# Patient Record
Sex: Male | Born: 1950 | Race: White | Hispanic: No | Marital: Married | Smoking: Never smoker
Health system: Southern US, Community
[De-identification: ages and names within clinical notes are randomized; demographics above are authoritative.]

## PROBLEM LIST (undated history)

## (undated) DIAGNOSIS — M109 Gout, unspecified: Secondary | ICD-10-CM

## (undated) DIAGNOSIS — R112 Nausea with vomiting, unspecified: Secondary | ICD-10-CM

## (undated) DIAGNOSIS — T4145XA Adverse effect of unspecified anesthetic, initial encounter: Secondary | ICD-10-CM

## (undated) DIAGNOSIS — B019 Varicella without complication: Secondary | ICD-10-CM

## (undated) DIAGNOSIS — Z87442 Personal history of urinary calculi: Secondary | ICD-10-CM

## (undated) DIAGNOSIS — Z9889 Other specified postprocedural states: Secondary | ICD-10-CM

## (undated) DIAGNOSIS — S0990XA Unspecified injury of head, initial encounter: Secondary | ICD-10-CM

## (undated) DIAGNOSIS — T8859XA Other complications of anesthesia, initial encounter: Secondary | ICD-10-CM

## (undated) DIAGNOSIS — G510 Bell's palsy: Secondary | ICD-10-CM

## (undated) DIAGNOSIS — M1990 Unspecified osteoarthritis, unspecified site: Secondary | ICD-10-CM

## (undated) HISTORY — PX: LITHOTRIPSY: SUR834

## (undated) HISTORY — PX: COLONOSCOPY: SHX174

## (undated) HISTORY — PX: OTHER SURGICAL HISTORY: SHX169

---

## 1998-12-08 DIAGNOSIS — Z9889 Other specified postprocedural states: Secondary | ICD-10-CM

## 1998-12-08 DIAGNOSIS — R112 Nausea with vomiting, unspecified: Secondary | ICD-10-CM

## 1998-12-08 DIAGNOSIS — G510 Bell's palsy: Secondary | ICD-10-CM

## 1998-12-08 HISTORY — DX: Nausea with vomiting, unspecified: R11.2

## 1998-12-08 HISTORY — DX: Other specified postprocedural states: Z98.890

## 1998-12-08 HISTORY — DX: Bell's palsy: G51.0

## 2005-02-24 ENCOUNTER — Ambulatory Visit: Payer: Self-pay | Admitting: Unknown Physician Specialty

## 2005-09-05 ENCOUNTER — Emergency Department: Payer: Self-pay | Admitting: Emergency Medicine

## 2005-09-10 ENCOUNTER — Ambulatory Visit: Payer: Self-pay

## 2005-09-18 ENCOUNTER — Ambulatory Visit: Payer: Self-pay | Admitting: Urology

## 2007-08-24 ENCOUNTER — Ambulatory Visit: Payer: Self-pay | Admitting: Gastroenterology

## 2011-06-21 ENCOUNTER — Emergency Department: Payer: Self-pay | Admitting: *Deleted

## 2012-07-19 IMAGING — CT CT ABD-PELV W/O CM
1 of 2 series · 14 of 32 positions shown, 18 images · non-contrast
Comparison: none

REASON FOR EXAM: (1) right flank pain; (2) right flank pain
COMMENTS:   May transport without cardiac monitor

[Series 2: stone · axial · 0.74mm/px · z∈[-398,+10]mm · 14 of 154 slices shown, 18 images]
[im 12/154  soft-tissue]
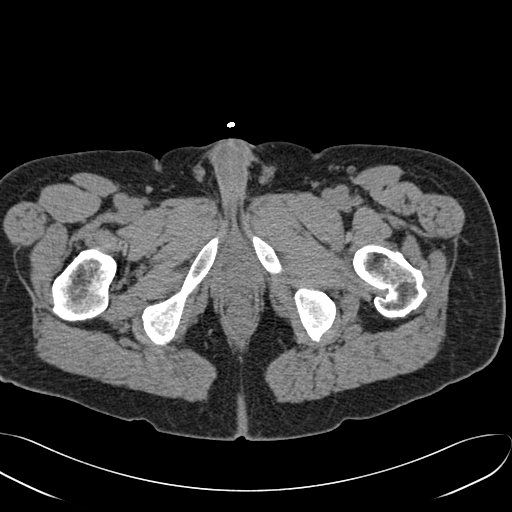
[im 12/154  bone]
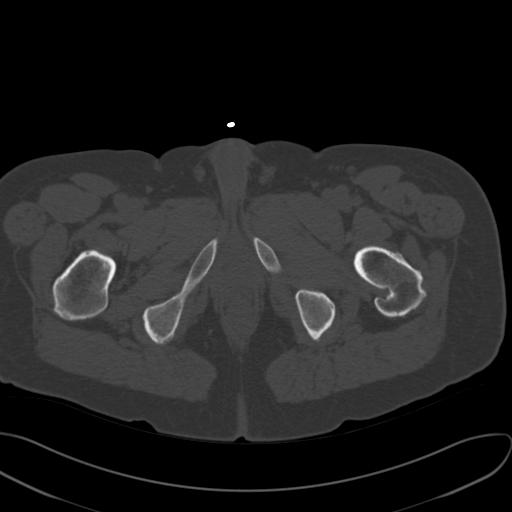
[im 24/154  soft-tissue]
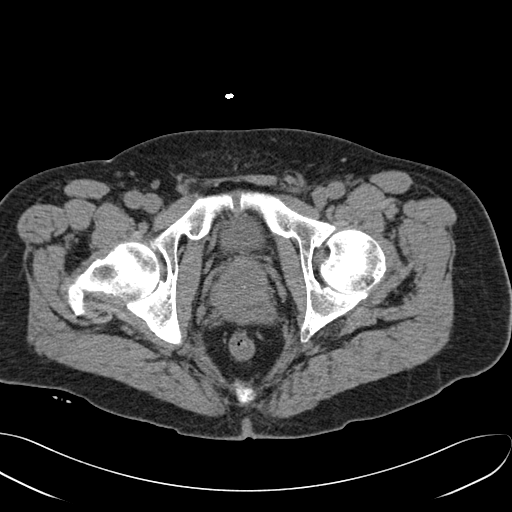
[im 36/154  soft-tissue]
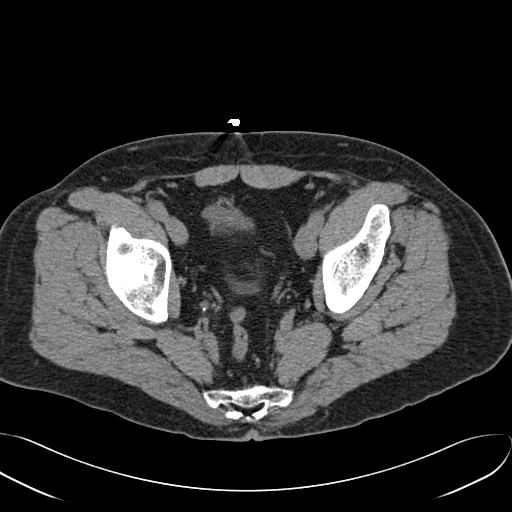
[im 48/154  soft-tissue]
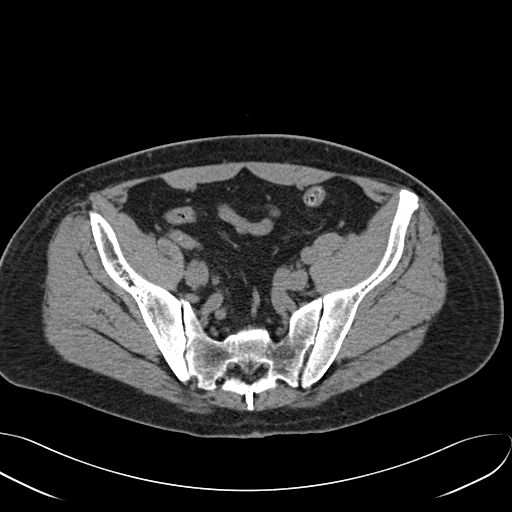
[im 59/154  soft-tissue]
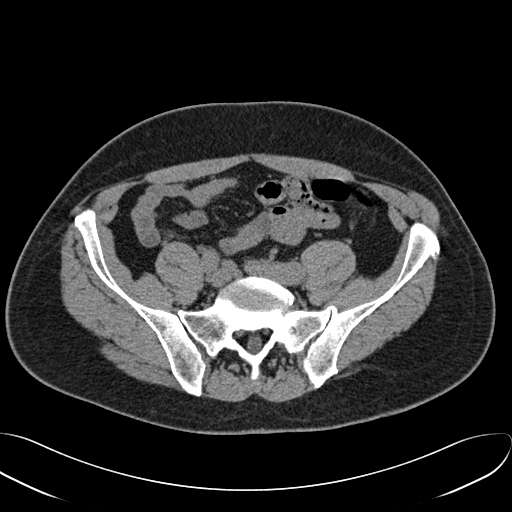
[im 71/154  soft-tissue]
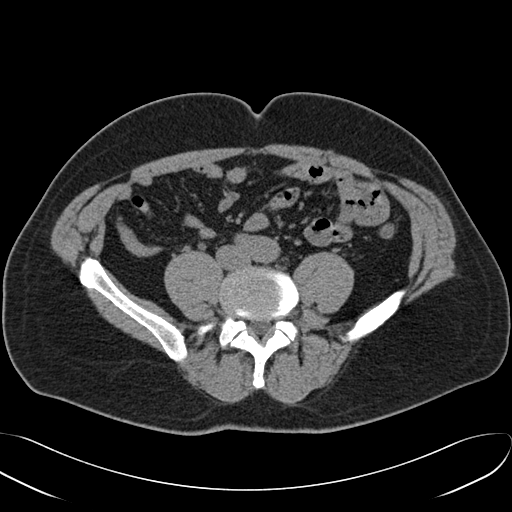
[im 83/154  soft-tissue]
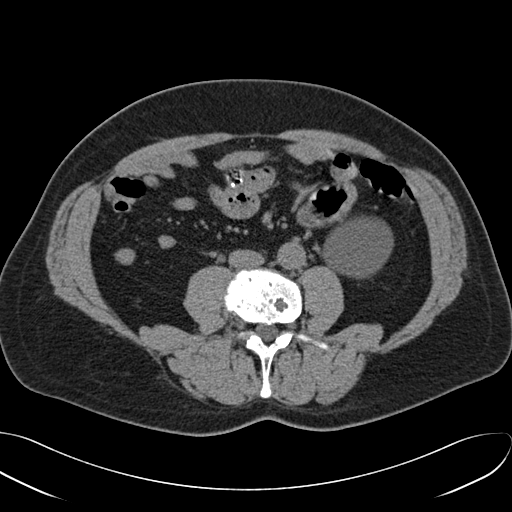
[im 95/154  soft-tissue]
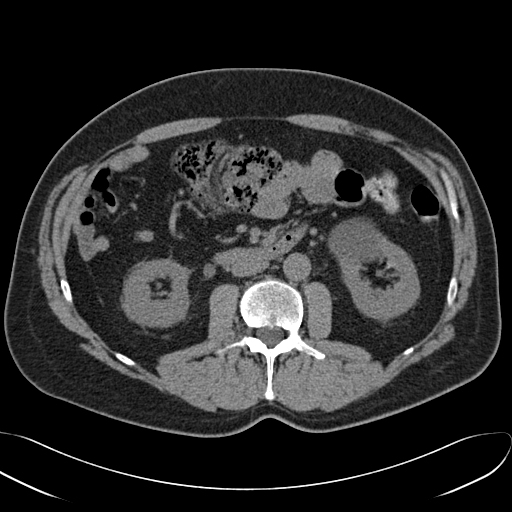
[im 106/154  soft-tissue]
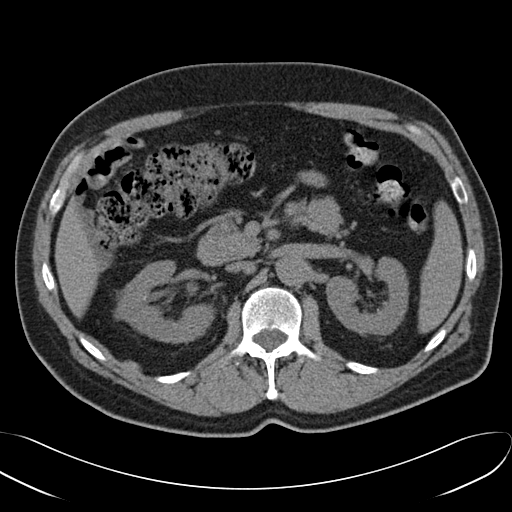
[im 106/154  bone]
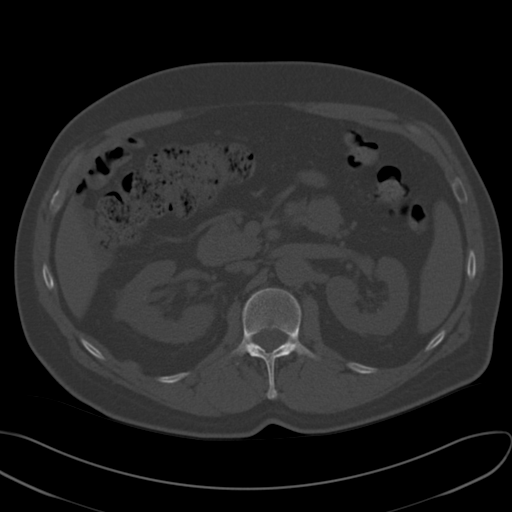
[im 118/154  soft-tissue]
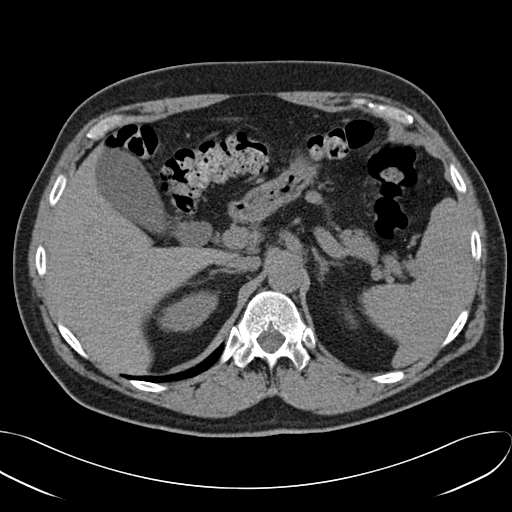
[im 130/154  soft-tissue]
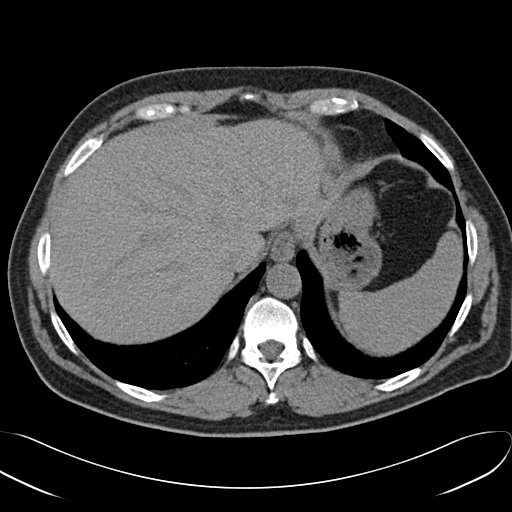
[im 130/154  lung]
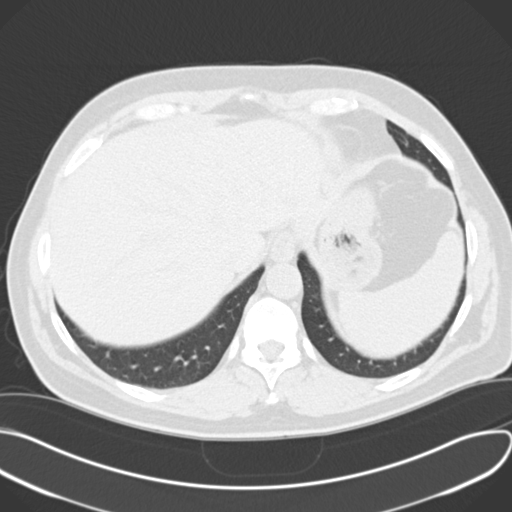
[im 136/154  lung]
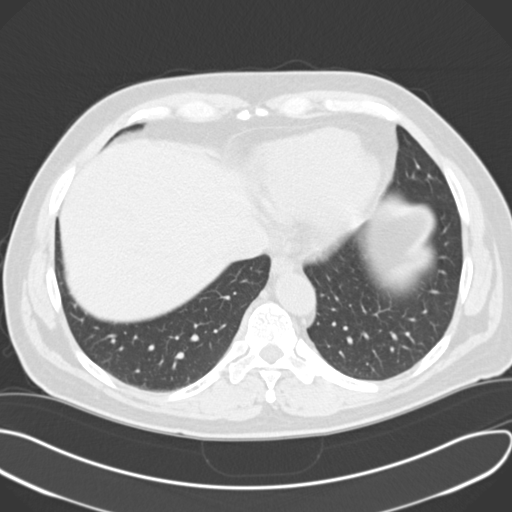
[im 142/154  soft-tissue]
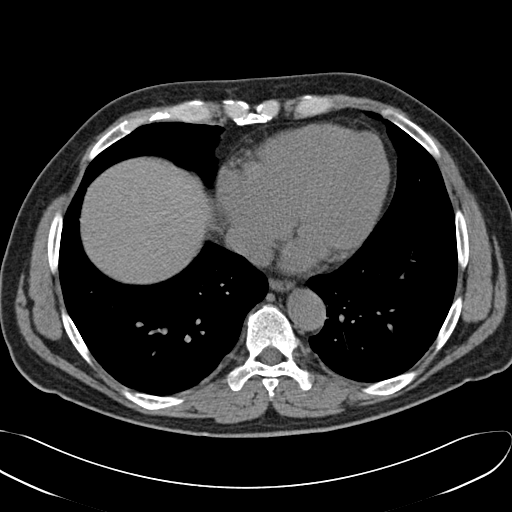
[im 142/154  lung]
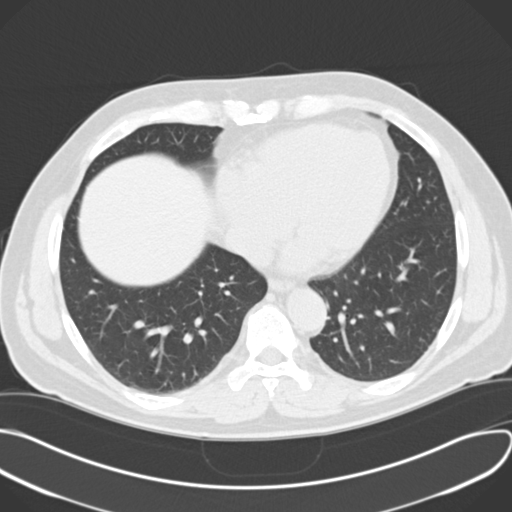
[im 148/154  lung]
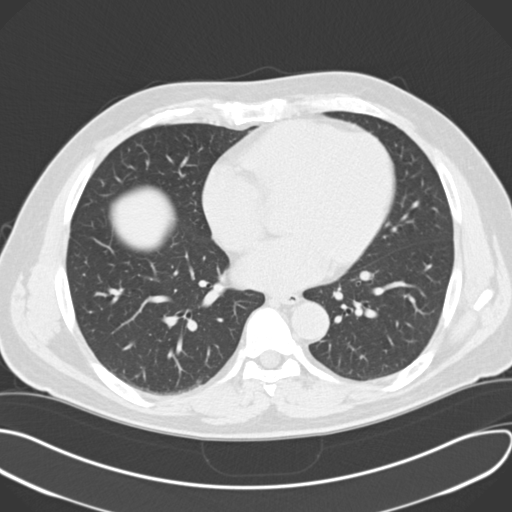

[14 of 32 positions shown; findings below may reference images not displayed]

PROCEDURE:     CT  - CT ABDOMEN AND PELVIS W[DATE]  [DATE]

RESULT:     Axial noncontrast CT scanning was performed through the abdomen
and pelvis at 3 mm intervals and slice thicknesses. Review of multiplanar
reconstructed images was performed separately on the VIA monitor.

There are stones in both kidneys. On the right there is mild hydronephrosis
and hydroureter secondary to a stone which is at the UVJ or just inside the
urinary bladder seen on image 125 through 127. The stone measures at least 4
mm in diameter. Other nonobstructing stones are present in the right kidney
with the largest measuring approximately 6 mm in diameter in a midpole
calyx. The left kidney exhibits stones measuring 5 mm in diameter but no
evidence of obstruction. There is an exophytic cystic appearing structure
with maximal dimension of 5.9 cm arising from the lower pole of the left
kidney. It exhibits Hounsfield measurement of +15.

The liver exhibits hypodensities in the left lobe which statistically are
most compatible with hemangiomas or cysts. These were faintly visible on a
CT scan dated 10 September, 2005. The gallbladder is adequately distended with
no evidence of stones. The spleen, nondistended stomach, pancreas, and
adrenal glands are within the limits of normal for the noncontrast study.
There is a small hiatal hernia. The caliber of the abdominal aorta is
normal. The unopacified loops of small and large bowel are normal in
appearance. The prostate gland and seminal vesicles exhibit no acute
abnormality. There are degenerative disc changes at the L3-L4 level with
evidence of a hemangioma in the body of L3. The lung bases are clear.
IMPRESSION: 1. There is mild hydronephrosis and hydroureter on the right secondary to a
stone about to pass into the urinary bladder or to a recently passed 4 mm
diameter stone.
2. There are stones elsewhere in both kidneys not producing obstruction.
There is likely a cyst from the lower pole of the left kidney.
3. I see no acute bowel abnormality. There are likely hemangiomas or cysts
in the left hepatic lobe.

A preliminary report was sent to the [HOSPITAL] the conclusion
of the study.

## 2014-10-17 DIAGNOSIS — G250 Essential tremor: Secondary | ICD-10-CM | POA: Insufficient documentation

## 2014-10-17 DIAGNOSIS — E7800 Pure hypercholesterolemia, unspecified: Secondary | ICD-10-CM | POA: Insufficient documentation

## 2015-10-22 DIAGNOSIS — M109 Gout, unspecified: Secondary | ICD-10-CM | POA: Insufficient documentation

## 2015-11-30 ENCOUNTER — Encounter
Admission: RE | Disposition: A | Discharge status: Home / Self Care | Payer: Self-pay | Source: Ambulatory Visit | Attending: Gastroenterology

## 2015-11-30 ENCOUNTER — Ambulatory Visit
Admission: RE | Admit: 2015-11-30 | Discharge: 2015-11-30 | Disposition: A | Discharge status: Home / Self Care | Payer: Managed Care, Other (non HMO) | Source: Ambulatory Visit | Attending: Gastroenterology | Admitting: Gastroenterology

## 2015-11-30 ENCOUNTER — Ambulatory Visit: Payer: Managed Care, Other (non HMO) | Admitting: Anesthesiology

## 2015-11-30 ENCOUNTER — Encounter: Payer: Self-pay | Admitting: Anesthesiology

## 2015-11-30 DIAGNOSIS — Z86010 Personal history of colonic polyps: Secondary | ICD-10-CM | POA: Insufficient documentation

## 2015-11-30 DIAGNOSIS — E7800 Pure hypercholesterolemia, unspecified: Secondary | ICD-10-CM | POA: Diagnosis not present

## 2015-11-30 DIAGNOSIS — M109 Gout, unspecified: Secondary | ICD-10-CM | POA: Diagnosis not present

## 2015-11-30 DIAGNOSIS — M1990 Unspecified osteoarthritis, unspecified site: Secondary | ICD-10-CM | POA: Diagnosis not present

## 2015-11-30 DIAGNOSIS — Z79899 Other long term (current) drug therapy: Secondary | ICD-10-CM | POA: Diagnosis not present

## 2015-11-30 DIAGNOSIS — Z1211 Encounter for screening for malignant neoplasm of colon: Secondary | ICD-10-CM | POA: Diagnosis present

## 2015-11-30 DIAGNOSIS — Z87442 Personal history of urinary calculi: Secondary | ICD-10-CM | POA: Insufficient documentation

## 2015-11-30 DIAGNOSIS — G709 Myoneural disorder, unspecified: Secondary | ICD-10-CM | POA: Insufficient documentation

## 2015-11-30 HISTORY — DX: Varicella without complication: B01.9

## 2015-11-30 HISTORY — PX: COLONOSCOPY WITH PROPOFOL: SHX5780

## 2015-11-30 HISTORY — DX: Bell's palsy: G51.0

## 2015-11-30 HISTORY — DX: Gout, unspecified: M10.9

## 2015-11-30 HISTORY — DX: Unspecified osteoarthritis, unspecified site: M19.90

## 2015-11-30 HISTORY — DX: Unspecified injury of head, initial encounter: S09.90XA

## 2015-11-30 SURGERY — COLONOSCOPY WITH PROPOFOL
Anesthesia: General

## 2015-11-30 MED ORDER — DEXAMETHASONE SODIUM PHOSPHATE 10 MG/ML IJ SOLN: INTRAMUSCULAR | Status: DC | PRN

## 2015-11-30 MED ORDER — FENTANYL CITRATE (PF) 100 MCG/2ML IJ SOLN
INTRAMUSCULAR | Status: DC | PRN
  Administered 2015-11-30: 50 ug via INTRAVENOUS

## 2015-11-30 MED ORDER — MIDAZOLAM HCL 2 MG/2ML IJ SOLN
INTRAMUSCULAR | Status: DC | PRN
  Administered 2015-11-30: 1 mg via INTRAVENOUS

## 2015-11-30 MED ORDER — PROPOFOL 10 MG/ML IV BOLUS
INTRAVENOUS | Status: DC | PRN
  Administered 2015-11-30: 30 mg via INTRAVENOUS

## 2015-11-30 MED ORDER — PROPOFOL 500 MG/50ML IV EMUL
INTRAVENOUS | Status: DC | PRN
  Administered 2015-11-30: 80 ug/kg/min via INTRAVENOUS

## 2015-11-30 MED ORDER — SODIUM CHLORIDE 0.9 % IV SOLN
INTRAVENOUS | Status: DC
  Administered 2015-11-30: 1000 mL via INTRAVENOUS

## 2015-11-30 MED ORDER — SODIUM CHLORIDE 0.9 % IV SOLN
INTRAVENOUS | Status: DC
  Administered 2015-11-30: 09:00:00 via INTRAVENOUS

## 2015-11-30 NOTE — Transfer of Care (Signed)
Immediate Anesthesia Transfer of Care Note  Patient: Edward Petersen  Procedure(s) Performed: Procedure(s): COLONOSCOPY WITH PROPOFOL (N/A)  Patient Location: PACU  Anesthesia Type:General  Level of Consciousness: awake, alert  and oriented  Airway & Oxygen Therapy: Patient Spontanous Breathing and Patient connected to nasal cannula oxygen  Post-op Assessment: Report given to RN and Post -op Vital signs reviewed and stable  Post vital signs: Reviewed and stable  Last Vitals:  Filed Vitals:   11/30/15 0836  BP: 124/79  Pulse: 67  Temp: 36.1 C  Resp: 16    Complications: No apparent anesthesia complications

## 2015-11-30 NOTE — Anesthesia Preprocedure Evaluation (Signed)
Anesthesia Evaluation  Patient identified by MRN, date of birth, ID band Patient awake    Reviewed: Allergy & Precautions, NPO status , Patient's Chart, lab work & pertinent test results, reviewed documented beta blocker date and time   Airway Mallampati: II  TM Distance: >3 FB     Dental  (+) Chipped   Pulmonary           Cardiovascular      Neuro/Psych  Neuromuscular disease    GI/Hepatic   Endo/Other    Renal/GU Renal InsufficiencyRenal disease     Musculoskeletal  (+) Arthritis ,   Abdominal   Peds  Hematology   Anesthesia Other Findings   Reproductive/Obstetrics                             Anesthesia Physical Anesthesia Plan  ASA: III  Anesthesia Plan: General   Post-op Pain Management:    Induction: Intravenous  Airway Management Planned: Nasal Cannula  Additional Equipment:   Intra-op Plan:   Post-operative Plan:   Informed Consent: I have reviewed the patients History and Physical, chart, labs and discussed the procedure including the risks, benefits and alternatives for the proposed anesthesia with the patient or authorized representative who has indicated his/her understanding and acceptance.     Plan Discussed with: CRNA  Anesthesia Plan Comments:         Anesthesia Quick Evaluation

## 2015-11-30 NOTE — H&P (Signed)
    Primary Care Physician:  Marcello Fennel, MD Primary Gastroenterologist:  Dr. Candace Cruise  Pre-Procedure History & Physical: HPI:  Edward Petersen is a 65 y.o. male is here for an colonoscopy.   Past Medical History  Diagnosis Date  . Bell's palsy   . Chickenpox   . Gout   . Kidney stones   . Traumatic injury of head   . Hypercholesteremia   . Osteoarthritis     Past Surgical History  Procedure Laterality Date  . Topaz of right elbow    . Lithotripsy    . Left shoulder rotator cuff repair    . Colonoscopy      Prior to Admission medications   Medication Sig Start Date End Date Taking? Authorizing Provider  Multiple Vitamin (MULTIVITAMIN) tablet Take 1 tablet by mouth daily.   Yes Historical Provider, MD  naproxen sodium (ANAPROX) 220 MG tablet Take 220 mg by mouth 2 (two) times daily with a meal.   Yes Historical Provider, MD  omega-3 acid ethyl esters (LOVAZA) 1 G capsule Take 1 g by mouth 2 (two) times daily.   Yes Historical Provider, MD    Allergies as of 11/20/2015  . (Not on File)    History reviewed. No pertinent family history.  Social History   Social History  . Marital Status: Married    Spouse Name: N/A  . Number of Children: N/A  . Years of Education: N/A   Occupational History  . Not on file.   Social History Main Topics  . Smoking status: Not on file  . Smokeless tobacco: Not on file  . Alcohol Use: Not on file  . Drug Use: Not on file  . Sexual Activity: Not on file   Other Topics Concern  . Not on file   Social History Narrative    Review of Systems: See HPI, otherwise negative ROS  Physical Exam: BP 124/79 mmHg  Pulse 67  Temp(Src) 96.9 F (36.1 C) (Oral)  Resp 16  Ht 5\' 9"  (1.753 m)  Wt 88.451 kg (195 lb)  BMI 28.78 kg/m2  SpO2 98% General:   Alert,  pleasant and cooperative in NAD Head:  Normocephalic and atraumatic. Neck:  Supple; no masses or thyromegaly. Lungs:  Clear throughout to auscultation.    Heart:  Regular rate  and rhythm. Abdomen:  Soft, nontender and nondistended. Normal bowel sounds, without guarding, and without rebound.   Neurologic:  Alert and  oriented x4;  grossly normal neurologically.  Impression/Plan: Edward Petersen is here for an colonoscopy to be performed for personal hx of colon polyps.  Risks, benefits, limitations, and alternatives regarding  colonoscopy have been reviewed with the patient.  Questions have been answered.  All parties agreeable.   Dymond Spreen, Lupita Dawn, MD  11/30/2015, 9:27 AM

## 2015-11-30 NOTE — Op Note (Signed)
[facility] Gastroenterology Patient Name: Edward Petersen Procedure Date: 11/30/2015 9:30 AM MRN: [id number] Account #: [id number] Date of Birth: [birth year] Admit Type: Outpatient Age: 64 Room: [facility] ENDO ROOM 4 Gender: Male Note Status: Finalized Procedure:         Colonoscopy Indications:       Personal history of colonic polyps Providers:         Lupita Dawn. Candace Cruise, MD Referring MD:      Caprice Renshaw (Referring MD) Medicines:         Monitored Anesthesia Care Complications:     No immediate complications. Procedure:         Pre-Anesthesia Assessment:                    - Prior to the procedure, a History and Physical was                     performed, and patient medications, allergies and                     sensitivities were reviewed. The patient's tolerance of                     previous anesthesia was reviewed.                    - The risks and benefits of the procedure and the sedation                     options and risks were discussed with the patient. All                     questions were answered and informed consent was obtained.                    - After reviewing the risks and benefits, the patient was                     deemed in satisfactory condition to undergo the procedure.                    After obtaining informed consent, the colonoscope was                     passed under direct vision. Throughout the procedure, the                     patient's blood pressure, pulse, and oxygen saturations                     were monitored continuously. The Colonoscope was                     introduced through the anus and advanced to the the cecum,                     identified by appendiceal orifice and ileocecal valve. The                     colonoscopy was performed without difficulty. The patient                     tolerated the procedure well. The quality of the bowel  preparation was good. Findings:      The colon  (entire examined portion) appeared normal. Impression:        - The entire examined colon is normal.                    - No specimens collected. Recommendation:    - Discharge patient to home.                    - Repeat colonoscopy in 5 years for surveillance.                    - The findings and recommendations were discussed with the                     patient. Procedure Code(s): --- Professional ---                    [phone], Colonoscopy, flexible; diagnostic, including                     collection of specimen(s) by brushing or washing, when                     performed (separate procedure) Diagnosis Code(s): --- Professional ---                    Z86.010, Personal history of colonic polyps CPT copyright 2014 American Medical Association. All rights reserved. The codes documented in this report are preliminary and upon coder review may  be revised to meet current compliance requirements. Hulen Luster, MD 11/30/2015 9:49:03 AM This report has been signed electronically. Number of Addenda: 0 Note Initiated On: 11/30/2015 9:30 AM Scope Withdrawal Time: 0 hours 4 minutes 46 seconds  Total Procedure Duration: 0 hours 8 minutes 27 seconds       [facility]

## 2015-11-30 NOTE — Anesthesia Postprocedure Evaluation (Signed)
Anesthesia Post Note  Patient: Edward Petersen  Procedure(s) Performed: Procedure(s) (LRB): COLONOSCOPY WITH PROPOFOL (N/A)  Patient location during evaluation: Endoscopy Anesthesia Type: General Level of consciousness: awake Pain management: pain level controlled Vital Signs Assessment: post-procedure vital signs reviewed and stable Respiratory status: spontaneous breathing Cardiovascular status: blood pressure returned to baseline Anesthetic complications: no    Last Vitals:  Filed Vitals:   11/30/15 0836 11/30/15 0953  BP: 124/79 115/79  Pulse: 67 59  Temp: 36.1 C 36.4 C  Resp: 16 13    Last Pain:  Filed Vitals:   11/30/15 1023  PainSc: 0-No pain                 Ziyonna Christner S

## 2015-12-04 ENCOUNTER — Encounter: Payer: Self-pay | Admitting: Gastroenterology

## 2016-12-09 ENCOUNTER — Inpatient Hospital Stay: Payer: Managed Care, Other (non HMO) | Admitting: Anesthesiology

## 2016-12-09 ENCOUNTER — Inpatient Hospital Stay
Admission: EM | Admit: 2016-12-09 | Discharge: 2016-12-10 | DRG: 694 | Disposition: A | Discharge status: Home / Self Care | Payer: Managed Care, Other (non HMO) | Attending: Internal Medicine | Admitting: Internal Medicine

## 2016-12-09 ENCOUNTER — Encounter: Payer: Self-pay | Admitting: *Deleted

## 2016-12-09 ENCOUNTER — Encounter
Admission: EM | Disposition: A | Discharge status: Home / Self Care | Payer: Self-pay | Source: Home / Self Care | Attending: Internal Medicine

## 2016-12-09 ENCOUNTER — Emergency Department: Payer: Managed Care, Other (non HMO)

## 2016-12-09 DIAGNOSIS — N400 Enlarged prostate without lower urinary tract symptoms: Secondary | ICD-10-CM | POA: Diagnosis present

## 2016-12-09 DIAGNOSIS — M109 Gout, unspecified: Secondary | ICD-10-CM | POA: Diagnosis present

## 2016-12-09 DIAGNOSIS — Z79899 Other long term (current) drug therapy: Secondary | ICD-10-CM

## 2016-12-09 DIAGNOSIS — R109 Unspecified abdominal pain: Secondary | ICD-10-CM

## 2016-12-09 DIAGNOSIS — N136 Pyonephrosis: Secondary | ICD-10-CM | POA: Diagnosis present

## 2016-12-09 DIAGNOSIS — N201 Calculus of ureter: Principal | ICD-10-CM

## 2016-12-09 DIAGNOSIS — Z87442 Personal history of urinary calculi: Secondary | ICD-10-CM | POA: Diagnosis not present

## 2016-12-09 DIAGNOSIS — Z888 Allergy status to other drugs, medicaments and biological substances status: Secondary | ICD-10-CM | POA: Diagnosis not present

## 2016-12-09 DIAGNOSIS — N179 Acute kidney failure, unspecified: Secondary | ICD-10-CM

## 2016-12-09 DIAGNOSIS — N281 Cyst of kidney, acquired: Secondary | ICD-10-CM | POA: Diagnosis present

## 2016-12-09 DIAGNOSIS — N12 Tubulo-interstitial nephritis, not specified as acute or chronic: Secondary | ICD-10-CM

## 2016-12-09 HISTORY — PX: CYSTOSCOPY WITH STENT PLACEMENT: SHX5790

## 2016-12-09 HISTORY — PX: CYSTOSCOPY W/ RETROGRADES: SHX1426

## 2016-12-09 LAB — COMPREHENSIVE METABOLIC PANEL
ALBUMIN: 3.7 g/dL (ref 3.5–5.0)
ALT: 18 U/L (ref 17–63)
ANION GAP: 8 (ref 5–15)
AST: 19 U/L (ref 15–41)
Alkaline Phosphatase: 70 U/L (ref 38–126)
BUN: 19 mg/dL (ref 6–20)
CO2: 29 mmol/L (ref 22–32)
Calcium: 9.4 mg/dL (ref 8.9–10.3)
Chloride: 99 mmol/L — ABNORMAL LOW (ref 101–111)
Creatinine, Ser: 1.88 mg/dL — ABNORMAL HIGH (ref 0.61–1.24)
GFR calc Af Amer: 42 mL/min — ABNORMAL LOW (ref >60)
GFR calc non Af Amer: 36 mL/min — ABNORMAL LOW (ref >60)
GLUCOSE: 106 mg/dL — AB (ref 65–99)
POTASSIUM: 3.8 mmol/L (ref 3.5–5.1)
SODIUM: 136 mmol/L (ref 135–145)
TOTAL PROTEIN: 7.5 g/dL (ref 6.5–8.1)
Total Bilirubin: 1.5 mg/dL — ABNORMAL HIGH (ref 0.3–1.2)

## 2016-12-09 LAB — URINALYSIS, COMPLETE (UACMP) WITH MICROSCOPIC
Bacteria, UA: NONE SEEN
Bilirubin Urine: NEGATIVE
GLUCOSE, UA: NEGATIVE mg/dL
Ketones, ur: 5 mg/dL — AB
NITRITE: NEGATIVE
Protein, ur: 30 mg/dL — AB
SPECIFIC GRAVITY, URINE: 1.016 (ref 1.005–1.030)
pH: 5 (ref 5.0–8.0)

## 2016-12-09 LAB — CBC
HCT: 40.5 % (ref 40.0–52.0)
Hemoglobin: 14.4 g/dL (ref 13.0–18.0)
MCH: 31.8 pg (ref 26.0–34.0)
MCHC: 35.5 g/dL (ref 32.0–36.0)
MCV: 89.6 fL (ref 80.0–100.0)
PLATELETS: 199 10*3/uL (ref 150–440)
RBC: 4.52 MIL/uL (ref 4.40–5.90)
RDW: 13.1 % (ref 11.5–14.5)
WBC: 11.6 10*3/uL — ABNORMAL HIGH (ref 3.8–10.6)

## 2016-12-09 LAB — SURGICAL PCR SCREEN
MRSA, PCR: NEGATIVE
Staphylococcus aureus: NEGATIVE

## 2016-12-09 LAB — LIPASE, BLOOD: Lipase: 16 U/L (ref 11–51)

## 2016-12-09 SURGERY — CYSTOSCOPY, WITH RETROGRADE PYELOGRAM
Anesthesia: General | Laterality: Bilateral

## 2016-12-09 MED ORDER — DEXTROSE 5 % IV SOLN: 1.0000 g | INTRAVENOUS | Status: DC

## 2016-12-09 MED ORDER — PSEUDOEPHEDRINE HCL 30 MG PO TABS
30.0000 mg | ORAL_TABLET | ORAL | Status: DC | PRN
  Filled 2016-12-09: qty 1

## 2016-12-09 MED ORDER — ONDANSETRON HCL 4 MG/2ML IJ SOLN
INTRAMUSCULAR | Status: AC
  Filled 2016-12-09: qty 2

## 2016-12-09 MED ORDER — PROPOFOL 10 MG/ML IV BOLUS
INTRAVENOUS | Status: AC
  Filled 2016-12-09: qty 20

## 2016-12-09 MED ORDER — IOPAMIDOL (ISOVUE-300) INJECTION 61%
INTRAVENOUS | Status: DC | PRN
  Administered 2016-12-09: 25 mL via URETHRAL

## 2016-12-09 MED ORDER — GLYCOPYRROLATE 0.2 MG/ML IJ SOLN
INTRAMUSCULAR | Status: DC | PRN
  Administered 2016-12-09: 0.2 mg via INTRAVENOUS

## 2016-12-09 MED ORDER — DEXTROSE 5 % IV SOLN: 1.0000 g | Freq: Once | INTRAVENOUS | Status: DC

## 2016-12-09 MED ORDER — FENTANYL CITRATE (PF) 100 MCG/2ML IJ SOLN
INTRAMUSCULAR | Status: AC
  Filled 2016-12-09: qty 2

## 2016-12-09 MED ORDER — MIDAZOLAM HCL 2 MG/2ML IJ SOLN
INTRAMUSCULAR | Status: AC
  Filled 2016-12-09: qty 2

## 2016-12-09 MED ORDER — LIDOCAINE 2% (20 MG/ML) 5 ML SYRINGE
INTRAMUSCULAR | Status: AC
  Filled 2016-12-09: qty 5

## 2016-12-09 MED ORDER — MORPHINE SULFATE (PF) 2 MG/ML IV SOLN
2.0000 mg | INTRAVENOUS | Status: DC | PRN
  Filled 2016-12-09: qty 1

## 2016-12-09 MED ORDER — DEXAMETHASONE SODIUM PHOSPHATE 10 MG/ML IJ SOLN
INTRAMUSCULAR | Status: AC
  Filled 2016-12-09: qty 1

## 2016-12-09 MED ORDER — DEXAMETHASONE SODIUM PHOSPHATE 10 MG/ML IJ SOLN
INTRAMUSCULAR | Status: DC | PRN
  Administered 2016-12-09: 5 mg via INTRAVENOUS

## 2016-12-09 MED ORDER — MORPHINE SULFATE (PF) 4 MG/ML IV SOLN
4.0000 mg | Freq: Once | INTRAVENOUS | Status: AC
  Administered 2016-12-09: 4 mg via INTRAVENOUS
  Filled 2016-12-09: qty 1

## 2016-12-09 MED ORDER — ADULT MULTIVITAMIN W/MINERALS CH
1.0000 | ORAL_TABLET | Freq: Every day | ORAL | Status: DC
  Administered 2016-12-10: 1 via ORAL
  Filled 2016-12-09: qty 1

## 2016-12-09 MED ORDER — CEFTRIAXONE SODIUM-DEXTROSE 1-3.74 GM-% IV SOLR
1.0000 g | INTRAVENOUS | Status: DC
  Administered 2016-12-10: 1 g via INTRAVENOUS
  Filled 2016-12-09: qty 50

## 2016-12-09 MED ORDER — ONDANSETRON HCL 4 MG/2ML IJ SOLN
4.0000 mg | Freq: Once | INTRAMUSCULAR | Status: AC
  Administered 2016-12-09: 4 mg via INTRAVENOUS
  Filled 2016-12-09: qty 2

## 2016-12-09 MED ORDER — HYDROCODONE-ACETAMINOPHEN 5-325 MG PO TABS
1.0000 | ORAL_TABLET | Freq: Four times a day (QID) | ORAL | Status: DC | PRN
  Administered 2016-12-09: 1 via ORAL
  Filled 2016-12-09: qty 1

## 2016-12-09 MED ORDER — LIDOCAINE HCL (PF) 2 % IJ SOLN
INTRAMUSCULAR | Status: DC | PRN
  Administered 2016-12-09: 100 mg

## 2016-12-09 MED ORDER — MIDAZOLAM HCL 5 MG/5ML IJ SOLN
INTRAMUSCULAR | Status: DC | PRN
  Administered 2016-12-09: 2 mg via INTRAVENOUS

## 2016-12-09 MED ORDER — GLYCOPYRROLATE 0.2 MG/ML IJ SOLN
INTRAMUSCULAR | Status: AC
  Filled 2016-12-09: qty 1

## 2016-12-09 MED ORDER — OMEGA-3-ACID ETHYL ESTERS 1 G PO CAPS
1.0000 g | ORAL_CAPSULE | Freq: Two times a day (BID) | ORAL | Status: DC
  Administered 2016-12-09 – 2016-12-10 (×2): 1 g via ORAL
  Filled 2016-12-09 (×2): qty 1

## 2016-12-09 MED ORDER — TAMSULOSIN HCL 0.4 MG PO CAPS
0.4000 mg | ORAL_CAPSULE | Freq: Every day | ORAL | Status: DC
  Administered 2016-12-10: 0.4 mg via ORAL
  Filled 2016-12-09: qty 1

## 2016-12-09 MED ORDER — ONDANSETRON HCL 4 MG/2ML IJ SOLN
INTRAMUSCULAR | Status: DC | PRN
  Administered 2016-12-09: 4 mg via INTRAVENOUS

## 2016-12-09 MED ORDER — CEFTRIAXONE SODIUM-DEXTROSE 1-3.74 GM-% IV SOLR
1.0000 g | Freq: Once | INTRAVENOUS | Status: AC
  Administered 2016-12-09: 1 g via INTRAVENOUS
  Filled 2016-12-09: qty 50

## 2016-12-09 MED ORDER — PROPOFOL 10 MG/ML IV BOLUS
INTRAVENOUS | Status: DC | PRN
  Administered 2016-12-09: 150 mg via INTRAVENOUS

## 2016-12-09 MED ORDER — SODIUM CHLORIDE 0.9 % IV SOLN
INTRAVENOUS | Status: DC
  Administered 2016-12-09: 1000 mL via INTRAVENOUS

## 2016-12-09 MED ORDER — ONDANSETRON HCL 4 MG/2ML IJ SOLN: 4.0000 mg | Freq: Four times a day (QID) | INTRAMUSCULAR | Status: DC | PRN

## 2016-12-09 MED ORDER — FENTANYL CITRATE (PF) 100 MCG/2ML IJ SOLN
INTRAMUSCULAR | Status: DC | PRN
  Administered 2016-12-09 (×2): 50 ug via INTRAVENOUS

## 2016-12-09 SURGICAL SUPPLY — 22 items
BAG DRAIN CYSTO-URO LG1000N (MISCELLANEOUS) ×3 IMPLANT
CATH URETL OPEN END 6X70 (CATHETERS) IMPLANT
CONRAY 43 FOR UROLOGY 50M (MISCELLANEOUS) ×3 IMPLANT
GLOVE BIO SURGEON STRL SZ7 (GLOVE) ×3 IMPLANT
GLOVE BIO SURGEON STRL SZ7.5 (GLOVE) ×3 IMPLANT
GOWN STRL REUS W/ TWL LRG LVL3 (GOWN DISPOSABLE) ×2 IMPLANT
GOWN STRL REUS W/ TWL XL LVL3 (GOWN DISPOSABLE) ×1 IMPLANT
GOWN STRL REUS W/TWL LRG LVL3 (GOWN DISPOSABLE) ×4
GOWN STRL REUS W/TWL XL LVL3 (GOWN DISPOSABLE) ×2
KIT RM TURNOVER CYSTO AR (KITS) ×3 IMPLANT
PACK CYSTO AR (MISCELLANEOUS) ×3 IMPLANT
PREP PVP WINGED SPONGE (MISCELLANEOUS) ×3 IMPLANT
SENSORWIRE 0.038 NOT ANGLED (WIRE) ×3
SOL .9 NS 3000ML IRR  AL (IV SOLUTION) ×2
SOL .9 NS 3000ML IRR UROMATIC (IV SOLUTION) ×1 IMPLANT
STENT URET 6FRX26 CONTOUR (STENTS) ×6 IMPLANT
SURGILUBE 2OZ TUBE FLIPTOP (MISCELLANEOUS) ×3 IMPLANT
SYRINGE 10CC LL (SYRINGE) ×3 IMPLANT
SYRINGE IRR TOOMEY STRL 70CC (SYRINGE) ×3 IMPLANT
TUBE FEED INF 8F (TUBING) IMPLANT
WATER STERILE IRR 1000ML POUR (IV SOLUTION) ×3 IMPLANT
WIRE SENSOR 0.038 NOT ANGLED (WIRE) ×1 IMPLANT

## 2016-12-09 NOTE — H&P (Signed)
[place] [facility] [place] NAME: Edward Petersen    MR#:  [id number]  DATE OF BIRTH:  12-15-1950  DATE OF ADMISSION:  12/09/2016  PRIMARY CARE PHYSICIAN: BABAOFF, Caryl Bis, MD   REQUESTING/REFERRING PHYSICIAN: kinner  CHIEF COMPLAINT:   Chief Complaint  Patient presents with  . Flank Pain    HISTORY OF PRESENT ILLNESS: Edward Petersen  is a 66 y.o. male with a known history of Bell's was C, gout, hypercholesterolemia, kidney stones- not on any regular home medications. Started having left-sided flank pain for last 3-4 days. He went to his primary care physician and he gave him hydrocodone to help with the pain as he had kidney stones in the past, patient did not do any further workup and suggested to watch if he passes the stone. He still did not pass the stone and continued to have pain in the left side flank location. He denies any fever or chills. But was feeling somewhat nauseated. In ER he is noted to have bilateral ureteral stone with left-sided hydronephrosis and worsening of the renal function. ER physician spoke to urologist, he suggested to do a procedure today in the evening and admitted to medical services.  PAST MEDICAL HISTORY:   Past Medical History:  Diagnosis Date  . Bell's palsy   . Chickenpox   . Gout   . Hypercholesteremia   . Kidney stones   . Osteoarthritis   . Traumatic injury of head     PAST SURGICAL HISTORY: Past Surgical History:  Procedure Laterality Date  . COLONOSCOPY    . COLONOSCOPY WITH PROPOFOL N/A 11/30/2015   Procedure: COLONOSCOPY WITH PROPOFOL;  Surgeon: Hulen Luster, MD;  Location: [facility] ENDOSCOPY;  Service: Gastroenterology;  Laterality: N/A;  . left shoulder rotator cuff repair    . LITHOTRIPSY    . topaz of right elbow      SOCIAL HISTORY:  Social History  Substance Use Topics  . Smoking status: Never Smoker  . Smokeless tobacco: Never Used  . Alcohol use No    FAMILY HISTORY:  Family History   Problem Relation Age of Onset  . Mesothelioma Father   . Pancreatic cancer Brother     DRUG ALLERGIES:  Allergies  Allergen Reactions  . Primidone Nausea Only    REVIEW OF SYSTEMS:   CONSTITUTIONAL: No fever, fatigue or weakness.  EYES: No blurred or double vision.  EARS, NOSE, AND THROAT: No tinnitus or ear pain.  RESPIRATORY: No cough, shortness of breath, wheezing or hemoptysis.  CARDIOVASCULAR: No chest pain, orthopnea, edema.  GASTROINTESTINAL: Some nausea, no vomiting, diarrhea or abdominal pain. He has left-sided flank pain. GENITOURINARY: No dysuria, hematuria.  ENDOCRINE: No polyuria, nocturia,  HEMATOLOGY: No anemia, easy bruising or bleeding SKIN: No rash or lesion. MUSCULOSKELETAL: No joint pain or arthritis.   NEUROLOGIC: No tingling, numbness, weakness.  PSYCHIATRY: No anxiety or depression.   MEDICATIONS AT HOME:  Prior to Admission medications   Medication Sig Start Date End Date Taking? Authorizing Provider  HYDROcodone-acetaminophen (NORCO/VICODIN) 5-325 MG tablet Take 1 tablet by mouth every 6 (six) hours as needed for moderate pain.   Yes Historical Provider, MD  Multiple Vitamin (MULTIVITAMIN) tablet Take 1 tablet by mouth daily.   Yes Historical Provider, MD  omega-3 acid ethyl esters (LOVAZA) 1 G capsule Take 1 g by mouth 2 (two) times daily.   Yes Historical Provider, MD  ondansetron (ZOFRAN-ODT) 4 MG disintegrating tablet Take 4 mg by mouth every  8 (eight) hours as needed for nausea or vomiting.   Yes Historical Provider, MD  pseudoephedrine (SUDAFED) 30 MG tablet Take 30 mg by mouth every 4 (four) hours as needed for congestion.   Yes Historical Provider, MD  tamsulosin (FLOMAX) 0.4 MG CAPS capsule Take 0.4 mg by mouth daily.   Yes Historical Provider, MD      PHYSICAL EXAMINATION:   VITAL SIGNS: Blood pressure (!) 126/91, pulse 63, temperature 98.3 F (36.8 C), temperature source Oral, resp. rate 11, height 5\' 9"  (1.753 m), weight 86.2 kg (190  lb), SpO2 94 %.  GENERAL:  66 y.o.-year-old patient lying in the bed with no acute distress.  EYES: Pupils equal, round, reactive to light and accommodation. No scleral icterus. Extraocular muscles intact.  HEENT: Head atraumatic, normocephalic. Oropharynx and nasopharynx clear.  NECK:  Supple, no jugular venous distention. No thyroid enlargement, no tenderness.  LUNGS: Normal breath sounds bilaterally, no wheezing, rales,rhonchi or crepitation. No use of accessory muscles of respiration.  CARDIOVASCULAR: S1, S2 normal. No murmurs, rubs, or gallops.  ABDOMEN: Soft, tender on left lower, nondistended. Bowel sounds present. No organomegaly or mass.  EXTREMITIES: No pedal edema, cyanosis, or clubbing.  NEUROLOGIC: Cranial nerves II through XII are intact. Muscle strength 5/5 in all extremities. Sensation intact. Gait not checked.  PSYCHIATRIC: The patient is alert and oriented x 3.  SKIN: No obvious rash, lesion, or ulcer.   LABORATORY PANEL:   CBC  Recent Labs Lab 12/09/16 0927  WBC 11.6*  HGB 14.4  HCT 40.5  PLT 199  MCV 89.6  MCH 31.8  MCHC 35.5  RDW 13.1   ------------------------------------------------------------------------------------------------------------------  Chemistries   Recent Labs Lab 12/09/16 0927  NA 136  K 3.8  CL 99*  CO2 29  GLUCOSE 106*  BUN 19  CREATININE 1.88*  CALCIUM 9.4  AST 19  ALT 18  ALKPHOS 70  BILITOT 1.5*   ------------------------------------------------------------------------------------------------------------------ estimated creatinine clearance is 42.6 mL/min (by C-G formula based on SCr of 1.88 mg/dL (H)). ------------------------------------------------------------------------------------------------------------------ No results for input(s): TSH, T4TOTAL, T3FREE, THYROIDAB in the last 72 hours.  Invalid input(s): FREET3   Coagulation profile No results for input(s): INR, PROTIME in the last 168  hours. ------------------------------------------------------------------------------------------------------------------- No results for input(s): DDIMER in the last 72 hours. -------------------------------------------------------------------------------------------------------------------  Cardiac Enzymes No results for input(s): CKMB, TROPONINI, MYOGLOBIN in the last 168 hours.  Invalid input(s): CK ------------------------------------------------------------------------------------------------------------------ Invalid input(s): POCBNP  ---------------------------------------------------------------------------------------------------------------  Urinalysis    Component Value Date/Time   COLORURINE YELLOW (A) 12/09/2016 0927   APPEARANCEUR CLEAR (A) 12/09/2016 0927   LABSPEC 1.016 12/09/2016 0927   PHURINE 5.0 12/09/2016 0927   GLUCOSEU NEGATIVE 12/09/2016 0927   HGBUR LARGE (A) 12/09/2016 0927   BILIRUBINUR NEGATIVE 12/09/2016 0927   KETONESUR 5 (A) 12/09/2016 0927   PROTEINUR 30 (A) 12/09/2016 0927   NITRITE NEGATIVE 12/09/2016 0927   LEUKOCYTESUR TRACE (A) 12/09/2016 0927     RADIOLOGY: Ct Renal Stone Study  Result Date: 12/09/2016 CLINICAL DATA:  Left flank pain for 5 days EXAM: CT ABDOMEN AND PELVIS WITHOUT CONTRAST TECHNIQUE: Multidetector CT imaging of the abdomen and pelvis was performed following the standard protocol without oral or intravenous contrast material administration. COMPARISON:  June 21, 2011 FINDINGS: Lower chest: Lung bases are clear.  There is a small hiatal hernia. Hepatobiliary: There is a focal area of decreased attenuation in the anterior segment of the right lobe of the liver measuring 1.6 x 1.1 cm, stable from prior study and likely benign hemangioma.  There is a 7 mm probable cyst near the fissure for the ligamentum teres in the right lobe of the liver anteriorly, stable. No new liver lesions are evident on this noncontrast enhanced study. There  is apparent sludge in the gallbladder with possible noncalcified gallstones. There is no gallbladder wall thickening. There is no biliary duct dilatation. Pancreas: There is no pancreatic mass or inflammatory focus. Spleen: No splenic lesions are evident. Adrenals/Urinary Tract: The right adrenal is unremarkable. There is an 8 x 8 mm apparent adenoma in the left adrenal. There is a cyst arising from the lower pole of the left kidney measuring 8.0 x 6.2 x 6.3 cm. There is moderate hydronephrosis on the left. There is no hydronephrosis on the right. On the right, there are multiple calculi throughout the kidney. The largest calculus in the right kidney is in the upper pole region measuring 1.3 x 0.8 cm. Other calculi range in size from as small as 3 mm to as large as 9 mm. Multiple calculi are noted in the left kidney ranging in size from as small as 1 mm to as large as 8 mm. There is subtle edema involving the left kidney. On the left, there is a calculus at the level of the mid sacrum in the left ureter measuring 1.1 x 0.6 cm. On the right, there is a calculus at the level of L4-5 measuring 7 x 7 mm which is not causing appreciable hydronephrosis. Note, however, that there is mild mesenteric thickening and fluid near this ureteral calculi on the right as well as fluid tracking into the right lateral mid pelvis. No other ureteral calculi identified. Urinary bladder is midline with wall thickness within normal limits. Stomach/Bowel: There is no bowel wall or mesenteric thickening. No bowel obstruction. No free air or portal venous air. Vascular/Lymphatic: There is no appreciable abdominal aortic aneurysm ; the aorta is mildly tortuous in the abdominal region. No appreciable vascular calcification is evident. There is no appreciable adenopathy in the abdomen or pelvis. Reproductive: Prostate appears rather prominent. Contains multiple calculi. The prostate impresses on the inferior aspect of the urinary bladder.  Seminal vesicles appear normal. Apart from the prostate, there is no pelvic mass. Other: There is no periappendiceal region inflammation. No abscess or ascites evident. There is fat in each inguinal ring. Musculoskeletal: There is a hemangioma in the L3 vertebral body, also present on prior study. There is focal disc narrowing at L3-4. There is a stable sclerotic focus in the left iliac crest, a possible bone island. No new sclerotic foci identified compared to prior study. No lytic or destructive bone lesions evident. There is no abdominal wall or intramuscular lesion. IMPRESSION: There is a ureteral calculus at the mid sacral level on the left measuring 1.1 x 0.6 cm which is causing moderate hydronephrosis and perinephric edema peer On the right there is a calculus at the L4-5 level measuring 7 x 7 mm with localized. Localized periureteral inflammation but no appreciable hydronephrosis on the right. There are multiple intrarenal calculi, slightly more on the right than on the left. Prostate is enlarged and abuts the inferior urinary bladder. There are multiple prostatic calculi. Advise correlation with PSA. Small left adrenal adenoma, a benign finding. Sludge and possible nonshadowing calculi in gallbladder. Gallbladder wall not thickened. Small hiatal hernia. No bowel obstruction. No abscess. No periappendiceal region inflammatory change. Hemangioma L3 vertebral body.  Bone island left iliac crest, stable. Electronically Signed   By: Lowella Grip III M.D.  On: 12/09/2016 09:54    EKG: No orders found for this or any previous visit.  IMPRESSION AND PLAN:  * Bilateral ureteral stone with hydronephrosis and pyelonephritis on the left side with acute renal failure    IV fluids, IV ceftriaxone, urine culture.   CT the nose shows bilateral ureteral stones.   Nothing by mouth for the procedure today in the evening.   There are some nonobstructing stones in bilateral kidneys also and further management  will be per urologist.  * Pain   Give IV morphine.  All the records are reviewed and case discussed with ED provider. Management plans discussed with the patient, family and they are in agreement.  CODE STATUS: full code. Code Status History    This patient does not have a recorded code status. Please follow your organizational policy for patients in this situation.     Patient's wife was present in the room during my visit.  TOTAL TIME TAKING CARE OF THIS PATIENT: 50 minutes.    Vaughan Basta M.D on 12/09/2016   Between 7am to 6pm - Pager - [phone]  After 6pm go to [email or web address] - password [email or web address] [facility]  Office  [phone]  CC: Primary care physician; BABAOFF, Caryl Bis, MD   Note: This dictation was prepared with Dragon dictation along with smaller phrase technology. Any transcriptional errors that result from this process are unintentional.

## 2016-12-09 NOTE — ED Notes (Signed)
Patient transported to CT 

## 2016-12-09 NOTE — Consult Note (Signed)
Reason for Consult: Bilateral Ureteral Stones, Acute Renal Failure.   Referring Physician: Lavonia Drafts MD  Star Age is an 66 y.o. male.   HPI:   1 - Bilateral Ureteral Stones - Rt mid 61m stone with intrarenal stones 653mx 4 and Lt distal 51m51mtone with intrarenal stones 4mm43m5 by ER CT 12/2016 on eval flank pain. All stones approx 750HU. One prior stone managed with SWL many years ago.   2 - Acute Renal Failure - Cr 1.88 by ER labs on admission. Unknown baseline, but denies prior renal insuficiency.    3 - Enlarging Left Renal Cyst - 8cm left lower pole cyst with some early mass effect by CT 12/2016, was 4cm 2006. No prior contrast imaging to r/o enhancement.    Today "BarrJahlil seen in consultation for above.     Past Medical History:  Diagnosis Date  . Bell's palsy   . Chickenpox   . Gout   . Hypercholesteremia   . Kidney stones   . Osteoarthritis   . Traumatic injury of head     Past Surgical History:  Procedure Laterality Date  . COLONOSCOPY    . COLONOSCOPY WITH PROPOFOL N/A 11/30/2015   Procedure: COLONOSCOPY WITH PROPOFOL;  Surgeon: PaulHulen Luster;  [facility];  Service: Gastroenterology;  Laterality: N/A;  . left shoulder rotator cuff repair    . LITHOTRIPSY    . topaz of right elbow      Family History  Problem Relation Age of Onset  . Mesothelioma Father   . Pancreatic cancer Brother     Social History:  reports that he has never smoked. He has never used smokeless tobacco. He reports that he does not drink alcohol or use drugs.  Allergies:  Allergies  Allergen Reactions  . Primidone Nausea Only    Medications: I have reviewed the patient's current medications.  Results for orders placed or performed during the hospital encounter of 12/09/16 (from the past 48 hour(s))  CBC     Status: Abnormal   Collection Time: 12/09/16  9:27 AM  Result Value Ref Range   WBC 11.6 (H) 3.8 - 10.6 K/uL   RBC 4.52 4.40 - 5.90 MIL/uL    Hemoglobin 14.4 13.0 - 18.0 g/dL   HCT 40.5 40.0 - 52.0 %   MCV 89.6 80.0 - 100.0 fL   MCH 31.8 26.0 - 34.0 pg   MCHC 35.5 32.0 - 36.0 g/dL   RDW 13.1 11.5 - 14.5 %   Platelets 199 150 - 440 K/uL  Comprehensive metabolic panel     Status: Abnormal   Collection Time: 12/09/16  9:27 AM  Result Value Ref Range   Sodium 136 135 - 145 mmol/L   Potassium 3.8 3.5 - 5.1 mmol/L   Chloride 99 (L) 101 - 111 mmol/L   CO2 29 22 - 32 mmol/L   Glucose, Bld 106 (H) 65 - 99 mg/dL   BUN 19 6 - 20 mg/dL   Creatinine, Ser 1.88 (H) 0.61 - 1.24 mg/dL   Calcium 9.4 8.9 - 10.3 mg/dL   Total Protein 7.5 6.5 - 8.1 g/dL   Albumin 3.7 3.5 - 5.0 g/dL   AST 19 15 - 41 U/L   ALT 18 17 - 63 U/L   Alkaline Phosphatase 70 38 - 126 U/L   Total Bilirubin 1.5 (H) 0.3 - 1.2 mg/dL   GFR calc non Af Amer 36 (L) >60 mL/min   GFR calc Af  Amer 42 (L) >60 mL/min    Comment: (NOTE) The eGFR has been calculated using the CKD EPI equation. This calculation has not been validated in all clinical situations. eGFR's persistently <60 mL/min signify possible Chronic Kidney Disease.    Anion gap 8 5 - 15  Lipase, blood     Status: None   Collection Time: 12/09/16  9:27 AM  Result Value Ref Range   Lipase 16 11 - 51 U/L  Urinalysis, Complete w Microscopic     Status: Abnormal   Collection Time: 12/09/16  9:27 AM  Result Value Ref Range   Color, Urine YELLOW (A) YELLOW   APPearance CLEAR (A) CLEAR   Specific Gravity, Urine 1.016 1.005 - 1.030   pH 5.0 5.0 - 8.0   Glucose, UA NEGATIVE NEGATIVE mg/dL   Hgb urine dipstick LARGE (A) NEGATIVE   Bilirubin Urine NEGATIVE NEGATIVE   Ketones, ur 5 (A) NEGATIVE mg/dL   Protein, ur 30 (A) NEGATIVE mg/dL   Nitrite NEGATIVE NEGATIVE   Leukocytes, UA TRACE (A) NEGATIVE   RBC / HPF TOO NUMEROUS TO COUNT 0 - 5 RBC/hpf   WBC, UA 6-30 0 - 5 WBC/hpf   Bacteria, UA NONE SEEN NONE SEEN   Squamous Epithelial / LPF 0-5 (A) NONE SEEN   Mucous PRESENT     Ct Renal Stone Study  Result  Date: 12/09/2016 CLINICAL DATA:  Left flank pain for 5 days EXAM: CT ABDOMEN AND PELVIS WITHOUT CONTRAST TECHNIQUE: Multidetector CT imaging of the abdomen and pelvis was performed following the standard protocol without oral or intravenous contrast material administration. COMPARISON:  June 21, 2011 FINDINGS: Lower chest: Lung bases are clear.  There is a small hiatal hernia. Hepatobiliary: There is a focal area of decreased attenuation in the anterior segment of the right lobe of the liver measuring 1.6 x 1.1 cm, stable from prior study and likely benign hemangioma. There is a 7 mm probable cyst near the fissure for the ligamentum teres in the right lobe of the liver anteriorly, stable. No new liver lesions are evident on this noncontrast enhanced study. There is apparent sludge in the gallbladder with possible noncalcified gallstones. There is no gallbladder wall thickening. There is no biliary duct dilatation. Pancreas: There is no pancreatic mass or inflammatory focus. Spleen: No splenic lesions are evident. Adrenals/Urinary Tract: The right adrenal is unremarkable. There is an 8 x 8 mm apparent adenoma in the left adrenal. There is a cyst arising from the lower pole of the left kidney measuring 8.0 x 6.2 x 6.3 cm. There is moderate hydronephrosis on the left. There is no hydronephrosis on the right. On the right, there are multiple calculi throughout the kidney. The largest calculus in the right kidney is in the upper pole region measuring 1.3 x 0.8 cm. Other calculi range in size from as small as 3 mm to as large as 9 mm. Multiple calculi are noted in the left kidney ranging in size from as small as 1 mm to as large as 8 mm. There is subtle edema involving the left kidney. On the left, there is a calculus at the level of the mid sacrum in the left ureter measuring 1.1 x 0.6 cm. On the right, there is a calculus at the level of L4-5 measuring 7 x 7 mm which is not causing appreciable hydronephrosis. Note,  however, that there is mild mesenteric thickening and fluid near this ureteral calculi on the right as well as fluid tracking into the  right lateral mid pelvis. No other ureteral calculi identified. Urinary bladder is midline with wall thickness within normal limits. Stomach/Bowel: There is no bowel wall or mesenteric thickening. No bowel obstruction. No free air or portal venous air. Vascular/Lymphatic: There is no appreciable abdominal aortic aneurysm ; the aorta is mildly tortuous in the abdominal region. No appreciable vascular calcification is evident. There is no appreciable adenopathy in the abdomen or pelvis. Reproductive: Prostate appears rather prominent. Contains multiple calculi. The prostate impresses on the inferior aspect of the urinary bladder. Seminal vesicles appear normal. Apart from the prostate, there is no pelvic mass. Other: There is no periappendiceal region inflammation. No abscess or ascites evident. There is fat in each inguinal ring. Musculoskeletal: There is a hemangioma in the L3 vertebral body, also present on prior study. There is focal disc narrowing at L3-4. There is a stable sclerotic focus in the left iliac crest, a possible bone island. No new sclerotic foci identified compared to prior study. No lytic or destructive bone lesions evident. There is no abdominal wall or intramuscular lesion. IMPRESSION: There is a ureteral calculus at the mid sacral level on the left measuring 1.1 x 0.6 cm which is causing moderate hydronephrosis and perinephric edema peer On the right there is a calculus at the L4-5 level measuring 7 x 7 mm with localized. Localized periureteral inflammation but no appreciable hydronephrosis on the right. There are multiple intrarenal calculi, slightly more on the right than on the left. Prostate is enlarged and abuts the inferior urinary bladder. There are multiple prostatic calculi. Advise correlation with PSA. Small left adrenal adenoma, a benign finding.  Sludge and possible nonshadowing calculi in gallbladder. Gallbladder wall not thickened. Small hiatal hernia. No bowel obstruction. No abscess. No periappendiceal region inflammatory change. Hemangioma L3 vertebral body.  Bone island left iliac crest, stable. Electronically Signed   By: Lowella Grip III M.D.   On: 12/09/2016 09:54    Review of Systems  Constitutional: Negative.  Negative for chills and fever.  HENT: Negative.   Eyes: Negative.   Respiratory: Negative.   Cardiovascular: Negative.   Gastrointestinal: Negative.   Genitourinary: Positive for flank pain.  Skin: Negative.   Neurological: Negative.   Endo/Heme/Allergies: Negative.   Psychiatric/Behavioral: Negative.    Blood pressure 133/90, pulse 62, temperature 98.3 F (36.8 C), temperature source Oral, resp. rate 10, height 5' 9"  (1.753 m), weight 86.2 kg (190 lb), SpO2 95 %. Physical Exam  Constitutional: He is oriented to person, place, and time. He appears well-developed.  HENT:  Head: Normocephalic.  Eyes: Pupils are equal, round, and reactive to light.  Neck: Normal range of motion.  Cardiovascular: Normal rate.   Respiratory: Effort normal.  GI: Soft.  Genitourinary:  Genitourinary Comments: Mild bilateral CVAT  Musculoskeletal: Normal range of motion.  Neurological: He is alert and oriented to person, place, and time.  Psychiatric: He has a normal mood and affect.    Assessment/Plan:  1 - Bilateral Ureteral Stones - proceed with bilatera ureteral stents today for decompression. Rec bilateral ureteroscopy with goal of stone free in elective setting, likely staged given volume of stone, followed by aggressive metabolic eval.   Risks, benefits, expected peri-op course discussed in detail.   2 - Acute Renal Failure - likely obstructive, plan as per above for renal decompression.   3 - Enlarging Left Renal Cyst - Rec dedicated contrast imaging, preferably renal MRI, after GFR normal to maximally r/o  cystic neoplasm given size progression.   Connell Bognar,  Drena Ham 12/09/2016, 1:27 PM

## 2016-12-09 NOTE — Anesthesia Preprocedure Evaluation (Addendum)
Anesthesia Evaluation  Patient identified by MRN, date of birth, ID band Patient awake    Reviewed: Allergy & Precautions, NPO status , Patient's Chart, lab work & pertinent test results, reviewed documented beta blocker date and time   Airway Mallampati: II  TM Distance: >3 FB     Dental  (+) Chipped   Pulmonary           Cardiovascular      Neuro/Psych  Neuromuscular disease    GI/Hepatic   Endo/Other    Renal/GU Renal disease     Musculoskeletal  (+) Arthritis ,   Abdominal   Peds  Hematology   Anesthesia Other Findings Gout. Bells palsy which has resolved. Tinnitus.  Reproductive/Obstetrics                            Anesthesia Physical Anesthesia Plan  ASA: III  Anesthesia Plan: General   Post-op Pain Management:    Induction: Intravenous  Airway Management Planned: LMA  Additional Equipment:   Intra-op Plan:   Post-operative Plan:   Informed Consent: I have reviewed the patients History and Physical, chart, labs and discussed the procedure including the risks, benefits and alternatives for the proposed anesthesia with the patient or authorized representative who has indicated his/her understanding and acceptance.     Plan Discussed with: CRNA  Anesthesia Plan Comments:         Anesthesia Quick Evaluation

## 2016-12-09 NOTE — ED Triage Notes (Signed)
Pt to ed with c/o left flank pain that started on Thursday, radiates to left groin area.  Hx of kidney stones.

## 2016-12-09 NOTE — ED Provider Notes (Signed)
[facility] Emergency Department Provider Note   ____________________________________________    I have reviewed the triage vital signs and the nursing notes.   HISTORY  Chief Complaint Flank Pain     HPI Edward Petersen is a 66 y.o. male who presents with complaints of left flank pain. Patient reports this pain is been occurring for about 5-7 days. He saw his PCP who put him on pain medication for suspected kidney stone. Patient had a kidney stone 10 years ago which required lithotripsy. He tried to go to [facility] urology today but was referred to the ED. He complains of severe cramping on the left, denies fevers or chills. No dysuria.   Past Medical History:  Diagnosis Date  . Bell's palsy   . Chickenpox   . Gout   . Hypercholesteremia   . Kidney stones   . Osteoarthritis   . Traumatic injury of head     There are no active problems to display for this patient.   Past Surgical History:  Procedure Laterality Date  . COLONOSCOPY    . COLONOSCOPY WITH PROPOFOL N/A 11/30/2015   Procedure: COLONOSCOPY WITH PROPOFOL;  Surgeon: Hulen Luster, MD;  Location: [facility] ENDOSCOPY;  Service: Gastroenterology;  Laterality: N/A;  . left shoulder rotator cuff repair    . LITHOTRIPSY    . topaz of right elbow      Prior to Admission medications   Medication Sig Start Date End Date Taking? Authorizing Provider  Multiple Vitamin (MULTIVITAMIN) tablet Take 1 tablet by mouth daily.    Historical Provider, MD  naproxen sodium (ANAPROX) 220 MG tablet Take 220 mg by mouth 2 (two) times daily with a meal.    Historical Provider, MD  omega-3 acid ethyl esters (LOVAZA) 1 G capsule Take 1 g by mouth 2 (two) times daily.    Historical Provider, MD     Allergies Primidone  No family history on file.  Social History Social History  Substance Use Topics  . Smoking status: Never Smoker  . Smokeless tobacco: Never Used  . Alcohol use No    Review of  Systems  Constitutional: No fever/chills  Cardiovascular: Denies chest pain. Respiratory: Denies shortness of breath. Gastrointestinal: As above  Genitourinary: Negative for dysuria. Musculoskeletal: Negative for back pain. Skin: Negative for rash. Neurological: Negative for headaches  10-point ROS otherwise negative.  ____________________________________________   PHYSICAL EXAM:  VITAL SIGNS: ED Triage Vitals  Enc Vitals Group     BP 12/09/16 0904 (!) 144/92     Pulse Rate 12/09/16 0904 85     Resp 12/09/16 0904 17     Temp 12/09/16 0907 98.3 F (36.8 C)     Temp Source 12/09/16 0907 Oral     SpO2 12/09/16 0904 98 %     Weight 12/09/16 0901 190 lb (86.2 kg)     Height 12/09/16 0901 5\' 9"  (1.753 m)     Head Circumference --      Peak Flow --      Pain Score 12/09/16 0901 9     Pain Loc --      Pain Edu? --      Excl. in [place]? --     Constitutional: Alert and oriented. No acute distress. Pleasant and interactive Eyes: Conjunctivae are normal.    Mouth/Throat: Mucous membranes are moist.    Cardiovascular: Normal rate, regular rhythm. Grossly normal heart sounds.  Good peripheral circulation. Respiratory: Normal respiratory effort.  No retractions. Lungs  CTAB. Gastrointestinal: Soft and nontender. No distention.  No CVA tenderness. Genitourinary: deferred Musculoskeletal: No lower extremity tenderness nor edema.  Warm and well perfused Neurologic:  Normal speech and language. No gross focal neurologic deficits are appreciated.  Skin:  Skin is warm, dry and intact. No rash noted. Psychiatric: Mood and affect are normal. Speech and behavior are normal.  ____________________________________________   LABS (all labs ordered are listed, but only abnormal results are displayed)  Labs Reviewed  CBC - Abnormal; Notable for the following:       Result Value   WBC 11.6 (*)    All other components within normal limits  COMPREHENSIVE METABOLIC PANEL - Abnormal; Notable  for the following:    Chloride 99 (*)    Glucose, Bld 106 (*)    Creatinine, Ser 1.88 (*)    Total Bilirubin 1.5 (*)    GFR calc non Af Amer 36 (*)    GFR calc Af Amer 42 (*)    All other components within normal limits  URINALYSIS, COMPLETE (UACMP) WITH MICROSCOPIC - Abnormal; Notable for the following:    Color, Urine YELLOW (*)    APPearance CLEAR (*)    Hgb urine dipstick LARGE (*)    Ketones, ur 5 (*)    Protein, ur 30 (*)    Leukocytes, UA TRACE (*)    Squamous Epithelial / LPF 0-5 (*)    All other components within normal limits  LIPASE, BLOOD   ____________________________________________  EKG  None ____________________________________________  RADIOLOGY  CT renal stone study demonstrates bilateral large kidney stones ____________________________________________   PROCEDURES  Procedure(s) performed: No    Critical Care performed: No ____________________________________________   INITIAL IMPRESSION / ASSESSMENT AND PLAN / ED COURSE  Pertinent labs & imaging results that were available during my care of the patient were reviewed by me and considered in my medical decision making (see chart for details).  Discussed CT results with Dr. Tresa Moore of urology who requested admission to medicine, nothing by mouth status and he will take to the operative room later this afternoon for stents. Discussed this with patient who agrees with the plan  Clinical Course    ____________________________________________   FINAL CLINICAL IMPRESSION(S) / ED DIAGNOSES  Final diagnoses:  Flank pain, acute  Ureterolithiasis  AKI (acute kidney injury) ([place])      NEW MEDICATIONS STARTED DURING THIS VISIT:  New Prescriptions   No medications on file     Note:  This document was prepared using Dragon voice recognition software and may include unintentional dictation errors.    Lavonia Drafts, MD 12/09/16 1048

## 2016-12-09 NOTE — Transfer of Care (Signed)
Immediate Anesthesia Transfer of Care Note  Patient: BARY FERRAIOLO  Procedure(s) Performed: Procedure(s): CYSTOSCOPY WITH RETROGRADE PYELOGRAM (Bilateral) CYSTOSCOPY WITH STENT PLACEMENT (Bilateral)  Patient Location: PACU  Anesthesia Type:General  Level of Consciousness: sedated  Airway & Oxygen Therapy: Patient Spontanous Breathing and Patient connected to face mask oxygen  Post-op Assessment: Report given to RN and Post -op Vital signs reviewed and stable  Post vital signs: Reviewed  Last Vitals:  Vitals:   12/09/16 1531 12/09/16 1747  BP: 133/90 115/73  Pulse: 70 76  Resp: 18 10  Temp: 36.8 C 36.7 C    Last Pain:  Vitals:   12/09/16 1600  TempSrc:   PainSc: 4          Complications: No apparent anesthesia complications

## 2016-12-09 NOTE — Anesthesia Postprocedure Evaluation (Signed)
Anesthesia Post Note  Patient: Edward Petersen  Procedure(s) Performed: Procedure(s) (LRB): CYSTOSCOPY WITH RETROGRADE PYELOGRAM (Bilateral) CYSTOSCOPY WITH STENT PLACEMENT (Bilateral)  Patient location during evaluation: PACU Anesthesia Type: General Level of consciousness: awake and alert Pain management: pain level controlled Vital Signs Assessment: post-procedure vital signs reviewed and stable Respiratory status: spontaneous breathing, nonlabored ventilation, respiratory function stable and patient connected to nasal cannula oxygen Cardiovascular status: blood pressure returned to baseline and stable Postop Assessment: no signs of nausea or vomiting Anesthetic complications: no     Last Vitals:  Vitals:   12/09/16 1817 12/09/16 1833  BP:  130/81  Pulse:  60  Resp:  18  Temp: 36.6 C 36.6 C    Last Pain:  Vitals:   12/09/16 1833  TempSrc: Oral  PainSc:                  Jeanenne Licea S

## 2016-12-09 NOTE — Anesthesia Procedure Notes (Signed)
Procedure Name: LMA Insertion Performed by: Freja Faro Pre-anesthesia Checklist: Patient identified, Patient being monitored, Timeout performed, Emergency Drugs available and Suction available Patient Re-evaluated:Patient Re-evaluated prior to inductionOxygen Delivery Method: Circle system utilized Preoxygenation: Pre-oxygenation with 100% oxygen Intubation Type: IV induction Ventilation: Mask ventilation without difficulty LMA: LMA inserted LMA Size: 4.0 Tube type: Oral Number of attempts: 1 Placement Confirmation: positive ETCO2 and breath sounds checked- equal and bilateral Tube secured with: Tape Dental Injury: Teeth and Oropharynx as per pre-operative assessment      

## 2016-12-09 NOTE — Brief Op Note (Signed)
12/09/2016  5:41 PM  PATIENT:  Star Age  66 y.o. male  PRE-OPERATIVE DIAGNOSIS:  Bilateral ureteral stones  POST-OPERATIVE DIAGNOSIS:  Bilateral ureteral stones  PROCEDURE:  Procedure(s): CYSTOSCOPY WITH RETROGRADE PYELOGRAM (Bilateral) CYSTOSCOPY WITH STENT PLACEMENT (Bilateral)  SURGEON:  Surgeon(s) and Role:    * Alexis Frock, MD - Primary  PHYSICIAN ASSISTANT:   ASSISTANTS: none   ANESTHESIA:   general  EBL:  Total I/O In: 300 [I.V.:300] Out: -   BLOOD ADMINISTERED:none  DRAINS: none   LOCAL MEDICATIONS USED:  NONE  SPECIMEN:  No Specimen  DISPOSITION OF SPECIMEN:  N/A  COUNTS:  YES  TOURNIQUET:  * No tourniquets in log *  DICTATION: .Other Dictation: Dictation Number [id number]  PLAN OF CARE: Admit to inpatient   PATIENT DISPOSITION:  PACU - hemodynamically stable.   Delay start of Pharmacological VTE agent (>24hrs) due to surgical blood loss or risk of bleeding: not applicable

## 2016-12-10 ENCOUNTER — Encounter: Payer: Self-pay | Admitting: Urology

## 2016-12-10 LAB — BASIC METABOLIC PANEL
Anion gap: 6 (ref 5–15)
BUN: 18 mg/dL (ref 6–20)
CHLORIDE: 102 mmol/L (ref 101–111)
CO2: 27 mmol/L (ref 22–32)
Calcium: 9.2 mg/dL (ref 8.9–10.3)
Creatinine, Ser: 1.1 mg/dL (ref 0.61–1.24)
GFR calc Af Amer: >60 mL/min (ref >60)
GFR calc non Af Amer: >60 mL/min (ref >60)
GLUCOSE: 121 mg/dL — AB (ref 65–99)
POTASSIUM: 4.4 mmol/L (ref 3.5–5.1)
Sodium: 135 mmol/L (ref 135–145)

## 2016-12-10 LAB — CBC
HEMATOCRIT: 38.2 % — AB (ref 40.0–52.0)
Hemoglobin: 13.6 g/dL (ref 13.0–18.0)
MCH: 32.1 pg (ref 26.0–34.0)
MCHC: 35.6 g/dL (ref 32.0–36.0)
MCV: 90.1 fL (ref 80.0–100.0)
Platelets: 241 10*3/uL (ref 150–440)
RBC: 4.24 MIL/uL — ABNORMAL LOW (ref 4.40–5.90)
RDW: 12.5 % (ref 11.5–14.5)
WBC: 9.5 10*3/uL (ref 3.8–10.6)

## 2016-12-10 LAB — URINE CULTURE: Culture: NO GROWTH

## 2016-12-10 MED ORDER — DOCUSATE SODIUM 100 MG PO CAPS: 100.0000 mg | ORAL_CAPSULE | Freq: Two times a day (BID) | ORAL | 0 refills | Status: DC

## 2016-12-10 MED ORDER — DOCUSATE SODIUM 100 MG PO CAPS
100.0000 mg | ORAL_CAPSULE | Freq: Two times a day (BID) | ORAL | Status: DC
  Administered 2016-12-10: 100 mg via ORAL
  Filled 2016-12-10: qty 1

## 2016-12-10 MED ORDER — OXYBUTYNIN CHLORIDE ER 10 MG PO TB24: 10.0000 mg | ORAL_TABLET | Freq: Every day | ORAL | 0 refills | Status: DC

## 2016-12-10 NOTE — Op Note (Signed)
NAMEMOMODOU, FRACASSO NO.:  [id number]  MEDICAL RECORD NO.:  [id number]  LOCATION:  229A                         FACILITY:  [facility]  PHYSICIAN:  Alexis Frock, MD     DATE OF BIRTH:  December 01, 1951  DATE OF PROCEDURE: 12/09/2016                              OPERATIVE REPORT   DIAGNOSES:  Bilateral ureteral stones.  Acute renal failure.  PROCEDURE: 1. Cystoscopy, bilateral pyelogram interpretation. 2. Insertion of bilateral ureteral stents, 6 x 26 Contour, no tether.  ESTIMATED BLOOD LOSS:  Nil.  COMPLICATION:  None.  SPECIMEN:  None.  FINDINGS: 1. Trilobar prostatic hypertrophy. 2. Mild-to-moderate bilateral hydroureteronephrosis, mobile filling     defects in left mid and right distal ureters. 3. Successful placement of bilateral ureteral stents, proximal in     upper pole, distal in urinary bladder.  INDICATION:  Mr. Eze is a 66 year old with history of prior nephrolithiasis.  He was found on workup of colicky flank pain to have acute renal failure and bilateral ureteral stones.  He was not anuric. His potassium was normal.  He clearly had impending renal demise so that urgent renal decompression be warranted.  Options were discussed including bilateral ureteral stenting being referred and he wished to proceed.  Informed consent was obtained and placed in the medical record.  We specifically discussed that this is not be addressing his stones or merely providing renal decompression, that his stones to be addressed at a later time in staged fashion.  DESCRIPTION OF PROCEDURE:  The patient being Oluwatise Springmeyer identified. Procedure being cysto and bilateral stent placement was confirmed. Procedure was carried out.  Time-out was performed.  Intravenous antibiotics were administered.  General LMA anesthesia introduced.  The patient was placed into a low lithotomy position.  Sterile field was created by prepping and draping the patient's penis, perineum,  and proximal thighs using iodine.  Next, cystourethroscopy was performed using a rigid cystoscope with offset lens.  Inspection of the anterior and posterior urethra was unremarkable.  Inspection of the prostatic urethra revealed some moderate trilobar prostatic hypertrophy with significant median lobe prostate mucosa that was somewhat friable. Inspection of the urinary bladder revealed no diverticula, calcifications, or papillary lesions.  Ureteral orifices were very close to the median lobe as expected.  The left ureteral orifice was cannulated with 6-French catheter and left retrograde pyelogram was obtained.  Left retrograde pyelogram demonstrated a single left ureter with single- system left kidney.  There was mild-to-moderate hydroureteronephrosis to a filling defect in the mid ureter consistent with known stone.  A 0.038 Zip wire was advanced to the level of the upper pole and the open-ended catheter was exchanged for a new 6 x 26 Contour-type stent using cystoscopic and fluoroscopic guidance.  Good proximal and distal deployment were noted.  Upper pole deployment proximally and bladder deployment distally.  Similarly a right retrograde pyelogram was obtained.  Right retrograde pyelogram demonstrated a single right ureter with single-system right kidney.  There was a mobile filling defect in distal ureter consistent with a stone.  A 0.038 Zip wire was advanced at the level of the upper pole, and a new 6 x 26 Contour-type stent was placed on  the right side proximal in upper pole, distal in urinary bladder. Bladder was emptied per cystoscope and the procedure terminated.  The patient tolerated the procedure well.  There were no immediate periprocedural complications.  The patient was taken to the postanesthesia care unit in stable condition.          ______________________________ Alexis Frock, MD     TM/MEDQ  D:  12/09/2016  T:  12/10/2016  Job:  [id number]

## 2016-12-10 NOTE — Progress Notes (Signed)
Patient alert and oriented, no acute distress noted. Urology office to call patient for follow up appointment. Patient vital signs stable, voiding without difficulty. No pain voiced. Patient ambulates without assistance.. Wife at the bedside to take patient home.

## 2016-12-11 NOTE — Discharge Summary (Signed)
[facility] [place] NAME: Edward Petersen    MR#:  [id number]  DATE OF BIRTH:  [birth year]  DATE OF ADMISSION:  12/09/2016   ADMITTING PHYSICIAN: Alexis Frock, MD  DATE OF DISCHARGE: 12/10/2016  3:55 PM  PRIMARY CARE PHYSICIAN: BABAOFF, MARC E, MD   ADMISSION DIAGNOSIS:   Ureterolithiasis [N20.1] AKI (acute kidney injury) ([place]) [N17.9] Flank pain, acute [R10.9]  DISCHARGE DIAGNOSIS:   Principal Problem:   Ureteral stone Active Problems:   Pyelonephritis   Acute renal failure (ARF) ([place])   SECONDARY DIAGNOSIS:   Past Medical History:  Diagnosis Date  . Bell's palsy   . Chickenpox   . Gout   . Hypercholesteremia   . Kidney stones   . Osteoarthritis   . Traumatic injury of head     HOSPITAL COURSE:   Emigrant male with past medical history significant for gout, hyperlipidemia and kidney stones presented to hospital secondary to significant flank pain and noted to have bilateral ureteral stones and hydronephrosis.  #1 I lateral ureteral stones-resulting in hydronephrosis. Appreciate urology consult. -Patient had ureteroscopy and bilateral stents placed. -Pain has improved. No fevers. No evidence of infection noted. Was on Rocephin in the hospital empirically. -Being discharged on Flomax and oxybutynin. -Outpatient urology follow-up.  #2 acute renal failure-secondary to obstructive causes. Improved after stent placement. Also decided fluids in the hospital.  #3 left renal cyst-continue to monitor as outpatient.  #4 hyperlipidemia-on omega-3 fatty acids.  Stable and is being discharged.   DISCHARGE CONDITIONS:   Stable CONSULTS OBTAINED:   Treatment Team:  Alexis Frock, MD  DRUG ALLERGIES:   Allergies  Allergen Reactions  . Primidone Nausea Only   DISCHARGE MEDICATIONS:   Allergies as of 12/10/2016      Reactions   Primidone Nausea Only      Medication List    STOP taking these  medications   pseudoephedrine 30 MG tablet Commonly known as:  SUDAFED     TAKE these medications   docusate sodium 100 MG capsule Commonly known as:  COLACE Take 1 capsule (100 mg total) by mouth 2 (two) times daily. While on pain medications   HYDROcodone-acetaminophen 5-325 MG tablet Commonly known as:  NORCO/VICODIN Take 1 tablet by mouth every 6 (six) hours as needed for moderate pain.   multivitamin tablet Take 1 tablet by mouth daily.   omega-3 acid ethyl esters 1 g capsule Commonly known as:  LOVAZA Take 1 g by mouth 2 (two) times daily.   ondansetron 4 MG disintegrating tablet Commonly known as:  ZOFRAN-ODT Take 4 mg by mouth every 8 (eight) hours as needed for nausea or vomiting.   oxybutynin 10 MG 24 hr tablet Commonly known as:  DITROPAN XL Take 1 tablet (10 mg total) by mouth at bedtime. X 2 weeks   tamsulosin 0.4 MG Caps capsule Commonly known as:  FLOMAX Take 0.4 mg by mouth daily.        DISCHARGE INSTRUCTIONS:   1. Urology follow-up in 2 weeks 2. PCP follow-up in 1-2 weeks  DIET:   Cardiac diet  ACTIVITY:   Activity as tolerated  OXYGEN:   Home Oxygen: No.  Oxygen Delivery: room air  DISCHARGE LOCATION:   home   If you experience worsening of your admission symptoms, develop shortness of breath, life threatening emergency, suicidal or homicidal thoughts you must seek medical attention immediately by calling 911 or calling your MD immediately  if symptoms less severe.  You Must read complete instructions/literature along with all the possible adverse reactions/side effects for all the Medicines you take and that have been prescribed to you. Take any new Medicines after you have completely understood and accpet all the possible adverse reactions/side effects.   Please note  You were cared for by a hospitalist during your hospital stay. If you have any questions about your discharge medications or the care you received while you were in  the hospital after you are discharged, you can call the unit and asked to speak with the hospitalist on call if the hospitalist that took care of you is not available. Once you are discharged, your primary care physician will handle any further medical issues. Please note that NO REFILLS for any discharge medications will be authorized once you are discharged, as it is imperative that you return to your primary care physician (or establish a relationship with a primary care physician if you do not have one) for your aftercare needs so that they can reassess your need for medications and monitor your lab values.    On the day of Discharge:  VITAL SIGNS:   Blood pressure (!) 141/86, pulse 63, temperature 98.4 F (36.9 C), temperature source Oral, resp. rate 18, height 5\' 9"  (1.753 m), weight 86.2 kg (190 lb), SpO2 98 %.  PHYSICAL EXAMINATION:    GENERAL:  66 y.o.-year-old patient lying in the bed with no acute distress.  EYES: Pupils equal, round, reactive to light and accommodation. No scleral icterus. Extraocular muscles intact.  HEENT: Head atraumatic, normocephalic. Oropharynx and nasopharynx clear.  NECK:  Supple, no jugular venous distention. No thyroid enlargement, no tenderness.  LUNGS: Normal breath sounds bilaterally, no wheezing, rales,rhonchi or crepitation. No use of accessory muscles of respiration.  CARDIOVASCULAR: S1, S2 normal. No murmurs, rubs, or gallops.  ABDOMEN: Soft, non-tender, non-distended. Bowel sounds present. No organomegaly or mass.  EXTREMITIES: No pedal edema, cyanosis, or clubbing.  NEUROLOGIC: Cranial nerves II through XII are intact. Muscle strength 5/5 in all extremities. Sensation intact. Gait not checked.  PSYCHIATRIC: The patient is alert and oriented x 3.  SKIN: No obvious rash, lesion, or ulcer.   DATA REVIEW:   CBC  Recent Labs Lab 12/10/16 0551  WBC 9.5  HGB 13.6  HCT 38.2*  PLT 241    Chemistries   Recent Labs Lab 12/09/16 0927  12/10/16 0551  NA 136 135  K 3.8 4.4  CL 99* 102  CO2 29 27  GLUCOSE 106* 121*  BUN 19 18  CREATININE 1.88* 1.10  CALCIUM 9.4 9.2  AST 19  --   ALT 18  --   ALKPHOS 70  --   BILITOT 1.5*  --      Microbiology Results  Results for orders placed or performed during the hospital encounter of 12/09/16  Urine culture     Status: None   Collection Time: 12/09/16  9:27 AM  Result Value Ref Range Status   Specimen Description URINE, RANDOM  Final   Special Requests NONE  Final   Culture NO GROWTH Performed at [facility]   Final   Report Status 12/10/2016 FINAL  Final  Surgical PCR screen     Status: None   Collection Time: 12/09/16  4:00 PM  Result Value Ref Range Status   MRSA, PCR NEGATIVE NEGATIVE Final   Staphylococcus aureus NEGATIVE NEGATIVE Final    Comment:        The Xpert SA Assay (FDA approved for NASAL  specimens in patients over 78 years of age), is one component of a comprehensive surveillance program.  Test performance has been validated by [facility] for patients greater than or equal to 17 year old. It is not intended to diagnose infection nor to guide or monitor treatment.     RADIOLOGY:  No results found.   Management plans discussed with the patient, family and they are in agreement.  CODE STATUS:  Code Status History    Date Active Date Inactive Code Status Order ID Comments User Context   12/09/2016  3:34 PM 12/10/2016  6:55 PM Full Code [id number]  Vaughan Basta, MD Inpatient      TOTAL TIME TAKING CARE OF THIS PATIENT: 38 minutes.    Gladstone Lighter M.D on 12/11/2016 at 4:19 PM  Between 7am to 6pm - Pager - [phone]  After 6pm go to [email or web address] - Proofreader  [facility]  Office  [phone]  CC: Primary care physician; BABAOFF, Caryl Bis, MD   Note: This dictation was prepared with Dragon dictation along with smaller phrase technology. Any transcriptional errors that result  from this process are unintentional.

## 2016-12-17 ENCOUNTER — Other Ambulatory Visit: Payer: Self-pay | Admitting: Radiology

## 2016-12-17 ENCOUNTER — Ambulatory Visit: Payer: Managed Care, Other (non HMO) | Admitting: Urology

## 2016-12-17 ENCOUNTER — Telehealth: Payer: Self-pay | Admitting: Radiology

## 2016-12-17 ENCOUNTER — Encounter: Payer: Self-pay | Admitting: Urology

## 2016-12-17 VITALS — BP 141/84 | HR 76 | Ht 69.0 in | Wt 199.0 lb

## 2016-12-17 DIAGNOSIS — N281 Cyst of kidney, acquired: Secondary | ICD-10-CM | POA: Diagnosis not present

## 2016-12-17 DIAGNOSIS — N201 Calculus of ureter: Secondary | ICD-10-CM

## 2016-12-17 DIAGNOSIS — N179 Acute kidney failure, unspecified: Secondary | ICD-10-CM

## 2016-12-17 NOTE — Telephone Encounter (Signed)
Notified pt of surgery scheduled with Dr Erlene Quan on 12/29/16, pre-admit testing appt on 12/22/16 @1 :00 & to call Friday prior to surgery for arrival time to SDS. Pt voices understanding.

## 2016-12-17 NOTE — Progress Notes (Signed)
12/17/2016 6:48 PM   Edward Petersen 66-08-1951 [id number]  Referring provider: Derinda Late, MD [phone] [facility] [place], [name] [address]  Chief Complaint  Patient presents with  . Nephrolithiasis    discuss surgery    HPI: 66 year old male who presents today for follow-up from recent admission for bilateral ureteral stones requiring ureteral stent placement.  1 - Bilateral Ureteral Stones - Rt mid 39mm stone with intrarenal stones 9mm x 4 and Lt distal 43mm stone with intrarenal stones 67mm x 5 by ER CT 12/2016 on eval flank pain. S/p bilateral ureteral stent placement on 12/09/16 by Dr. Phebe Colla.  All stones approx 750HU. One prior stone managed with SWL many years ago.   2 - Acute Renal Failure - Cr 1.88 by ER labs on admission. Unknown baseline, but denies prior renal insuficiency.  Improved to 1.10 by discharge on POD1.     3 - Enlarging Left Renal Cyst - 8cm left lower pole cyst with some early mass effect by CT 12/2016, was 4cm 2006. No prior contrast imaging to r/o enhancement.    PMH: Past Medical History:  Diagnosis Date  . Bell's palsy   . Chickenpox   . Gout   . Hypercholesteremia   . Kidney stones   . Osteoarthritis   . Traumatic injury of head     Surgical History: Past Surgical History:  Procedure Laterality Date  . COLONOSCOPY    . COLONOSCOPY WITH PROPOFOL N/A 11/30/2015   Procedure: COLONOSCOPY WITH PROPOFOL;  Surgeon: Hulen Luster, MD;  [facility] END[facility]COPY;  Service: Gastroenterology;  Laterality: N/A;  . CYSTOSCOPY W/ RETROGRADES Bilateral 12/09/2016   Procedure: CYSTOSCOPY WITH RETROGRADE PYELOGRAM;  Surgeon: Alexis Frock, MD;  Location: [facility] ORS;  Service: Urology;  Laterality: Bilateral;  . CYSTOSCOPY WITH STENT PLACEMENT Bilateral 12/09/2016   Procedure: CYSTOSCOPY WITH STENT PLACEMENT;  Surgeon: Alexis Frock, MD;  Location: [facility] ORS;  Service: Urology;  Laterality: Bilateral;  . left shoulder  rotator cuff repair    . LITHOTRIPSY    . topaz of right elbow      Home Medications:  Allergies as of 12/17/2016      Reactions   Primidone Nausea Only      Medication List       Accurate as of 12/17/16 11:59 PM. Always use your most recent med list.          acetaminophen 500 MG tablet Commonly known as:  TYLENOL Take 500 mg by mouth every 6 (six) hours as needed.   [employer] EAR DROPS OT Place 4 drops into the right eye 4 (four) times daily.   docusate sodium 100 MG capsule Commonly known as:  COLACE Take 1 capsule (100 mg total) by mouth 2 (two) times daily. While on pain medications   ibuprofen 200 MG tablet Commonly known as:  ADVIL,MOTRIN Take 200 mg by mouth every 6 (six) hours as needed.   multivitamin with minerals Tabs tablet Take 1 tablet by mouth daily.   omega-3 acid ethyl esters 1 g capsule Commonly known as:  LOVAZA Take 1 g by mouth daily.   oxybutynin 10 MG 24 hr tablet Commonly known as:  DITROPAN XL Take 1 tablet (10 mg total) by mouth at bedtime. X 2 weeks   VITAMIN D PO Take 1 tablet by mouth daily.       Allergies:  Allergies  Allergen Reactions  . Primidone Nausea Only    Family  History: Family History  Problem Relation Petersen of Onset  . Mesothelioma Father   . Pancreatic cancer Brother     Social History:  reports that he has never smoked. He has never used smokeless tobacco. He reports that he does not drink alcohol or use drugs.  ROS: UROLOGY Frequent Urination?: No Hard to postpone urination?: No Burning/pain with urination?: Yes Get up at night to urinate?: No Leakage of urine?: No Urine stream starts and stops?: No Trouble starting stream?: No Do you have to strain to urinate?: No Blood in urine?: No Urinary tract infection?: No Sexually transmitted disease?: No Injury to kidneys or bladder?: No Painful intercourse?: No Weak stream?: No Erection problems?: No Penile pain?: No  Gastrointestinal Nausea?:  No Vomiting?: No Indigestion/heartburn?: No Diarrhea?: No Constipation?: No  Constitutional Fever: No Night sweats?: Yes Weight loss?: No Fatigue?: No  Skin Skin rash/lesions?: No Itching?: No  Eyes Blurred vision?: Yes Double vision?: No  Ears/Nose/Throat Sore throat?: No Sinus problems?: No  Hematologic/Lymphatic Swollen glands?: No Easy bruising?: No  Cardiovascular Leg swelling?: No Chest pain?: No  Respiratory Cough?: No Shortness of breath?: No  Endocrine Excessive thirst?: No  Musculoskeletal Back pain?: No Joint pain?: No  Neurological Headaches?: No Dizziness?: No  Psychologic Depression?: No Anxiety?: No  Physical Exam: BP (!) 141/84   Pulse 76   Ht 5\' 9"  (1.753 m)   Wt 199 lb (90.3 kg)   BMI 29.39 kg/m   Constitutional:  Alert and oriented, No acute distress. HEENT: Trail AT, moist mucus membranes.  Trachea midline, no masses. Cardiovascular: No clubbing, cyanosis, or edema. RRR. Respiratory: Normal respiratory effort, no increased work of breathing.  CTAB. GI: Abdomen is soft, nontender, nondistended, no abdominal masses GU: No CVA tenderness.  Skin: No rashes, bruises or suspicious lesions. Neurologic: Grossly intact, no focal deficits, moving all 4 extremities. Psychiatric: Normal mood and affect.  Laboratory Data: Lab Results  Component Value Date   WBC 9.5 12/10/2016   HGB 13.6 12/10/2016   HCT 38.2 (L) 12/10/2016   MCV 90.1 12/10/2016   PLT 241 12/10/2016    Lab Results  Component Value Date   CREATININE 1.10 12/10/2016     Urinalysis    Component Value Date/Time   COLORURINE YELLOW (A) 12/09/2016 0927   APPEARANCEUR Hazy (A) 12/17/2016 1402   LABSPEC 1.016 12/09/2016 0927   PHURINE 5.0 12/09/2016 0927   GLUCOSEU Negative 12/17/2016 1402   HGBUR LARGE (A) 12/09/2016 0927   BILIRUBINUR Negative 12/17/2016 1402   KETONESUR 5 (A) 12/09/2016 0927   PROTEINUR Trace (A) 12/17/2016 1402   PROTEINUR 30 (A)  12/09/2016 0927   NITRITE Negative 12/17/2016 1402   NITRITE NEGATIVE 12/09/2016 0927   LEUKOCYTESUR Trace (A) 12/17/2016 1402    Pertinent Imaging: Study Result   CLINICAL DATA:  Left flank pain for 5 days  EXAM: CT ABDOMEN AND PELVIS WITHOUT CONTRAST  TECHNIQUE: Multidetector CT imaging of the abdomen and pelvis was performed following the standard protocol without oral or intravenous contrast material administration.  COMPARISON:  June 21, 2011  FINDINGS: Lower chest: Lung bases are clear.  There is a small hiatal hernia.  Hepatobiliary: There is a focal area of decreased attenuation in the anterior segment of the right lobe of the liver measuring 1.6 x 1.1 cm, stable from prior study and likely benign hemangioma. There is a 7 mm probable cyst near the fissure for the ligamentum teres in the right lobe of the liver anteriorly, stable. No new liver  lesions are evident on this noncontrast enhanced study. There is apparent sludge in the gallbladder with possible noncalcified gallstones. There is no gallbladder wall thickening. There is no biliary duct dilatation.  Pancreas: There is no pancreatic mass or inflammatory focus.  Spleen: No splenic lesions are evident.  Adrenals/Urinary Tract: The right adrenal is unremarkable. There is an 8 x 8 mm apparent adenoma in the left adrenal. There is a cyst arising from the lower pole of the left kidney measuring 8.0 x 6.2 x 6.3 cm. There is moderate hydronephrosis on the left. There is no hydronephrosis on the right. On the right, there are multiple calculi throughout the kidney. The largest calculus in the right kidney is in the upper pole region measuring 1.3 x 0.8 cm. Other calculi range in size from as small as 3 mm to as large as 9 mm. Multiple calculi are noted in the left kidney ranging in size from as small as 1 mm to as large as 8 mm. There is subtle edema involving the left kidney. On the left, there is a  calculus at the level of the mid sacrum in the left ureter measuring 1.1 x 0.6 cm. On the right, there is a calculus at the level of L4-5 measuring 7 x 7 mm which is not causing appreciable hydronephrosis. Note, however, that there is mild mesenteric thickening and fluid near this ureteral calculi on the right as well as fluid tracking into the right lateral mid pelvis. No other ureteral calculi identified. Urinary bladder is midline with wall thickness within normal limits.  Stomach/Bowel: There is no bowel wall or mesenteric thickening. No bowel obstruction. No free air or portal venous air.  Vascular/Lymphatic: There is no appreciable abdominal aortic aneurysm ; the aorta is mildly tortuous in the abdominal region. No appreciable vascular calcification is evident. There is no appreciable adenopathy in the abdomen or pelvis.  Reproductive: Prostate appears rather prominent. Contains multiple calculi. The prostate impresses on the inferior aspect of the urinary bladder. Seminal vesicles appear normal. Apart from the prostate, there is no pelvic mass.  Other: There is no periappendiceal region inflammation. No abscess or ascites evident. There is fat in each inguinal ring.  Musculoskeletal: There is a hemangioma in the L3 vertebral body, also present on prior study. There is focal disc narrowing at L3-4. There is a stable sclerotic focus in the left iliac crest, a possible bone island. No new sclerotic foci identified compared to prior study. No lytic or destructive bone lesions evident. There is no abdominal wall or intramuscular lesion.  IMPRESSION: There is a ureteral calculus at the mid sacral level on the left measuring 1.1 x 0.6 cm which is causing moderate hydronephrosis and perinephric edema peer  On the right there is a calculus at the L4-5 level measuring 7 x 7 mm with localized. Localized periureteral inflammation but no appreciable hydronephrosis on the  right.  There are multiple intrarenal calculi, slightly more on the right than on the left.  Prostate is enlarged and abuts the inferior urinary bladder. There are multiple prostatic calculi. Advise correlation with PSA.  Small left adrenal adenoma, a benign finding.  Sludge and possible nonshadowing calculi in gallbladder. Gallbladder wall not thickened.  Small hiatal hernia.  No bowel obstruction. No abscess. No periappendiceal region inflammatory change.  Hemangioma L3 vertebral body.  Bone island left iliac crest, stable.   Electronically Signed   By: Lowella Grip III M.D.   On: 12/09/2016 09:54   CT scan  reviewed with the patient and personally today.  Assessment & Plan:    1. Bilateral ureteral stones S/p bilateral ureteral stent placement Given that he has bilateral ureteral stents, I would recommend ureteroscopy to clear his obstructing stone burden. Risks and benefits of ureteroscopy were reviewed including but not limited to infection, bleeding, pain, ureteral injury which could require open surgery versus prolonged indwelling if ureteralperforation occurs, persistent stone disease, requirement for staged procedure, possible stent, and global anesthesia risks. Patient expressed understanding and desires to proceed with ureteroscopy. Preop urine culture He will likely require further stone surgery to treat his nonobstructing stone burden in the future along with stone analysis and metatbolic work up - CULTURE, URINE COMPREHENSIVE - Urinalysis, Complete  2. Acute renal failure, unspecified acute renal failure type ([place]) 2/2 bilateral obstruction s/p stent  3. Renal cyst, left Enlarging, asymptomatic. Will assess further with post ureteroscopic renal ultrasound.  Schedule bilateral ureteroscopy   Hollice Espy, MD  [facility] [address] [place], [place] [address] [phone]

## 2016-12-18 LAB — URINALYSIS, COMPLETE
Bilirubin, UA: NEGATIVE
GLUCOSE, UA: NEGATIVE
Ketones, UA: NEGATIVE
Nitrite, UA: NEGATIVE
Specific Gravity, UA: 1.015 (ref 1.005–1.030)
Urobilinogen, Ur: 0.2 mg/dL (ref 0.2–1.0)
pH, UA: 6.5 (ref 5.0–7.5)

## 2016-12-18 LAB — MICROSCOPIC EXAMINATION
BACTERIA UA: NONE SEEN
RBC, UA: >30 /hpf — AB (ref 0–?)

## 2016-12-20 LAB — CULTURE, URINE COMPREHENSIVE

## 2016-12-22 ENCOUNTER — Encounter
Admission: RE | Admit: 2016-12-22 | Discharge: 2016-12-22 | Disposition: A | Discharge status: Home / Self Care | Payer: Managed Care, Other (non HMO) | Source: Ambulatory Visit | Attending: Urology | Admitting: Urology

## 2016-12-22 HISTORY — DX: Nausea with vomiting, unspecified: R11.2

## 2016-12-22 HISTORY — DX: Adverse effect of unspecified anesthetic, initial encounter: T41.45XA

## 2016-12-22 HISTORY — DX: Personal history of urinary calculi: Z87.442

## 2016-12-22 HISTORY — DX: Other complications of anesthesia, initial encounter: T88.59XA

## 2016-12-22 HISTORY — DX: Other specified postprocedural states: Z98.890

## 2016-12-22 NOTE — Pre-Procedure Instructions (Signed)
Requested an EKG be done on pt today due to his age of 2, he declined siting he is in good health except gout and arthritis and he recently had anesthesia with stent placement.

## 2016-12-22 NOTE — Patient Instructions (Signed)
  Your procedure is scheduled [id number] Jan. 22 , 2018. Report to Same Day Surgery. To find out your arrival time please call [phone] between 1PM - 3PM on Friday Jan. 19, 2018.  Remember: Instructions that are not followed completely may result in serious medical risk, up to and including death, or upon the discretion of your surgeon and anesthesiologist your surgery may need to be rescheduled.    _x___ 1. Do not eat food or drink liquids after midnight. No gum chewing or hard candies.     ____ 2. No Alcohol for 24 hours before or after surgery.   ____ 3. Bring all medications with you on the day of surgery if instructed.    __x__ 4. Notify your doctor if there is any change in your medical condition     (cold, fever, infections).    _____ 5. No smoking 24 hours prior to surgery.     Do not wear jewelry, make-up, hairpins, clips or nail polish.  Do not wear lotions, powders, or perfumes.   Do not shave 48 hours prior to surgery. Men may shave face and neck.  Do not bring valuables to the hospital.    [facility] is not responsible for any belongings or valuables.               Contacts, dentures or bridgework may not be worn into surgery.  Leave your suitcase in the car. After surgery it may be brought to your room.  For patients admitted to the hospital, discharge time is determined by your treatment team.   Patients discharged the day of surgery will not be allowed to drive home.    Please read over the following fact sheets that you were given:   [facility] Preparing for Surgery  ____ Take these medicines the morning of surgery with A SIP OF WATER: NONE     ____ Fleet Enema (as directed)   ____ Use CHG Soap as directed on instruction sheet  ____ Use inhalers on the day of surgery and bring to hospital day of surgery  ____ Stop metformin 2 days prior to surgery    ____ Take 1/2 of usual insulin dose the night before surgery and none on the morning of surgery.    ____ Stop Coumadin/Plavix/aspirin on does not apply.  _x___ Stop Anti-inflammatories such as Advil, Aleve, Ibuprofen, Motrin, Naproxen, Naprosyn, Goodies powders or aspirin products. OK to take Tylenol.   ____ Stop supplements: omega-3 acid ethyl esters (LOVAZA)  until after surgery.    ____ Bring C-Pap to the hospital.

## 2016-12-25 MED ORDER — SODIUM CHLORIDE FLUSH 0.9 % IV SOLN
INTRAVENOUS | Status: AC
  Filled 2016-12-25: qty 10

## 2016-12-29 ENCOUNTER — Ambulatory Visit: Payer: Managed Care, Other (non HMO) | Admitting: Anesthesiology

## 2016-12-29 ENCOUNTER — Encounter
Admission: RE | Disposition: A | Discharge status: Home / Self Care | Payer: Self-pay | Source: Ambulatory Visit | Attending: Urology

## 2016-12-29 ENCOUNTER — Encounter: Payer: Self-pay | Admitting: *Deleted

## 2016-12-29 ENCOUNTER — Ambulatory Visit
Admission: RE | Admit: 2016-12-29 | Discharge: 2016-12-29 | Disposition: A | Discharge status: Home / Self Care | Payer: Managed Care, Other (non HMO) | Source: Ambulatory Visit | Attending: Urology | Admitting: Urology

## 2016-12-29 DIAGNOSIS — Z8619 Personal history of other infectious and parasitic diseases: Secondary | ICD-10-CM | POA: Diagnosis not present

## 2016-12-29 DIAGNOSIS — N400 Enlarged prostate without lower urinary tract symptoms: Secondary | ICD-10-CM | POA: Insufficient documentation

## 2016-12-29 DIAGNOSIS — D1809 Hemangioma of other sites: Secondary | ICD-10-CM | POA: Insufficient documentation

## 2016-12-29 DIAGNOSIS — M109 Gout, unspecified: Secondary | ICD-10-CM | POA: Insufficient documentation

## 2016-12-29 DIAGNOSIS — Z79899 Other long term (current) drug therapy: Secondary | ICD-10-CM | POA: Diagnosis not present

## 2016-12-29 DIAGNOSIS — Z888 Allergy status to other drugs, medicaments and biological substances status: Secondary | ICD-10-CM | POA: Insufficient documentation

## 2016-12-29 DIAGNOSIS — E7800 Pure hypercholesterolemia, unspecified: Secondary | ICD-10-CM | POA: Diagnosis not present

## 2016-12-29 DIAGNOSIS — Z808 Family history of malignant neoplasm of other organs or systems: Secondary | ICD-10-CM | POA: Insufficient documentation

## 2016-12-29 DIAGNOSIS — Z800 Family history of malignant neoplasm of digestive organs: Secondary | ICD-10-CM | POA: Diagnosis not present

## 2016-12-29 DIAGNOSIS — N420 Calculus of prostate: Secondary | ICD-10-CM | POA: Diagnosis not present

## 2016-12-29 DIAGNOSIS — N201 Calculus of ureter: Secondary | ICD-10-CM

## 2016-12-29 DIAGNOSIS — N189 Chronic kidney disease, unspecified: Secondary | ICD-10-CM | POA: Diagnosis not present

## 2016-12-29 DIAGNOSIS — N281 Cyst of kidney, acquired: Secondary | ICD-10-CM | POA: Insufficient documentation

## 2016-12-29 DIAGNOSIS — M1990 Unspecified osteoarthritis, unspecified site: Secondary | ICD-10-CM | POA: Insufficient documentation

## 2016-12-29 DIAGNOSIS — Z791 Long term (current) use of non-steroidal anti-inflammatories (NSAID): Secondary | ICD-10-CM | POA: Diagnosis not present

## 2016-12-29 DIAGNOSIS — Z87442 Personal history of urinary calculi: Secondary | ICD-10-CM | POA: Diagnosis not present

## 2016-12-29 DIAGNOSIS — N132 Hydronephrosis with renal and ureteral calculous obstruction: Secondary | ICD-10-CM | POA: Diagnosis not present

## 2016-12-29 DIAGNOSIS — K449 Diaphragmatic hernia without obstruction or gangrene: Secondary | ICD-10-CM | POA: Diagnosis not present

## 2016-12-29 DIAGNOSIS — N179 Acute kidney failure, unspecified: Secondary | ICD-10-CM | POA: Insufficient documentation

## 2016-12-29 DIAGNOSIS — Z9889 Other specified postprocedural states: Secondary | ICD-10-CM | POA: Insufficient documentation

## 2016-12-29 DIAGNOSIS — D3502 Benign neoplasm of left adrenal gland: Secondary | ICD-10-CM | POA: Insufficient documentation

## 2016-12-29 DIAGNOSIS — N202 Calculus of kidney with calculus of ureter: Secondary | ICD-10-CM | POA: Diagnosis not present

## 2016-12-29 HISTORY — PX: CYSTOSCOPY W/ URETERAL STENT PLACEMENT: SHX1429

## 2016-12-29 HISTORY — PX: URETEROSCOPY WITH HOLMIUM LASER LITHOTRIPSY: SHX6645

## 2016-12-29 SURGERY — URETEROSCOPY, WITH LITHOTRIPSY USING HOLMIUM LASER
Anesthesia: General | Laterality: Bilateral | Wound class: Clean Contaminated

## 2016-12-29 MED ORDER — DEXAMETHASONE SODIUM PHOSPHATE 10 MG/ML IJ SOLN
INTRAMUSCULAR | Status: AC
  Filled 2016-12-29: qty 1

## 2016-12-29 MED ORDER — SUGAMMADEX SODIUM 200 MG/2ML IV SOLN
INTRAVENOUS | Status: DC | PRN
  Administered 2016-12-29: 200 mg via INTRAVENOUS

## 2016-12-29 MED ORDER — LIDOCAINE HCL (CARDIAC) 20 MG/ML IV SOLN
INTRAVENOUS | Status: DC | PRN
  Administered 2016-12-29: 100 mg via INTRAVENOUS

## 2016-12-29 MED ORDER — FAMOTIDINE 20 MG PO TABS
20.0000 mg | ORAL_TABLET | Freq: Once | ORAL | Status: AC
  Administered 2016-12-29: 20 mg via ORAL

## 2016-12-29 MED ORDER — ONDANSETRON HCL 4 MG/2ML IJ SOLN
INTRAMUSCULAR | Status: AC
  Filled 2016-12-29: qty 2

## 2016-12-29 MED ORDER — SUGAMMADEX SODIUM 200 MG/2ML IV SOLN
INTRAVENOUS | Status: AC
  Filled 2016-12-29: qty 2

## 2016-12-29 MED ORDER — ONDANSETRON HCL 4 MG/2ML IJ SOLN
INTRAMUSCULAR | Status: DC | PRN
  Administered 2016-12-29: 4 mg via INTRAVENOUS

## 2016-12-29 MED ORDER — PROPOFOL 10 MG/ML IV BOLUS
INTRAVENOUS | Status: DC | PRN
  Administered 2016-12-29: 150 mg via INTRAVENOUS

## 2016-12-29 MED ORDER — ROCURONIUM BROMIDE 100 MG/10ML IV SOLN
INTRAVENOUS | Status: DC | PRN
  Administered 2016-12-29 (×2): 15 mg via INTRAVENOUS
  Administered 2016-12-29: 25 mg via INTRAVENOUS

## 2016-12-29 MED ORDER — FENTANYL CITRATE (PF) 100 MCG/2ML IJ SOLN: 25.0000 ug | INTRAMUSCULAR | Status: DC | PRN

## 2016-12-29 MED ORDER — LACTATED RINGERS IV SOLN
INTRAVENOUS | Status: DC
  Administered 2016-12-29: 08:00:00 via INTRAVENOUS

## 2016-12-29 MED ORDER — AMPICILLIN SODIUM 1 G IJ SOLR
1.0000 g | INTRAMUSCULAR | Status: AC
  Administered 2016-12-29: 1 g via INTRAVENOUS

## 2016-12-29 MED ORDER — TAMSULOSIN HCL 0.4 MG PO CAPS: 0.4000 mg | ORAL_CAPSULE | Freq: Every day | ORAL | 0 refills | Status: DC

## 2016-12-29 MED ORDER — SODIUM CHLORIDE 0.9 % IV SOLN
INTRAVENOUS | Status: AC
  Filled 2016-12-29: qty 1000

## 2016-12-29 MED ORDER — FENTANYL CITRATE (PF) 100 MCG/2ML IJ SOLN
INTRAMUSCULAR | Status: AC
Start: 2016-12-29 — End: 2016-12-29
  Filled 2016-12-29: qty 2

## 2016-12-29 MED ORDER — EPHEDRINE 5 MG/ML INJ
INTRAVENOUS | Status: AC
  Filled 2016-12-29: qty 10

## 2016-12-29 MED ORDER — IOTHALAMATE MEGLUMINE 43 % IV SOLN
INTRAVENOUS | Status: DC | PRN
  Administered 2016-12-29: 20 mL

## 2016-12-29 MED ORDER — PROPOFOL 10 MG/ML IV BOLUS
INTRAVENOUS | Status: AC
  Filled 2016-12-29: qty 20

## 2016-12-29 MED ORDER — LIDOCAINE 2% (20 MG/ML) 5 ML SYRINGE
INTRAMUSCULAR | Status: AC
  Filled 2016-12-29: qty 5

## 2016-12-29 MED ORDER — SUCCINYLCHOLINE CHLORIDE 20 MG/ML IJ SOLN
INTRAMUSCULAR | Status: DC | PRN
  Administered 2016-12-29: 100 mg via INTRAVENOUS

## 2016-12-29 MED ORDER — DEXAMETHASONE SODIUM PHOSPHATE 10 MG/ML IJ SOLN
INTRAMUSCULAR | Status: DC | PRN
  Administered 2016-12-29: 5 mg via INTRAVENOUS

## 2016-12-29 MED ORDER — FAMOTIDINE 20 MG PO TABS
ORAL_TABLET | ORAL | Status: AC
  Filled 2016-12-29: qty 1

## 2016-12-29 MED ORDER — ONDANSETRON HCL 4 MG/2ML IJ SOLN: 4.0000 mg | Freq: Once | INTRAMUSCULAR | Status: DC | PRN

## 2016-12-29 MED ORDER — SUCCINYLCHOLINE CHLORIDE 200 MG/10ML IV SOSY
PREFILLED_SYRINGE | INTRAVENOUS | Status: AC
  Filled 2016-12-29: qty 10

## 2016-12-29 MED ORDER — FENTANYL CITRATE (PF) 100 MCG/2ML IJ SOLN
INTRAMUSCULAR | Status: DC | PRN
  Administered 2016-12-29 (×2): 50 ug via INTRAVENOUS

## 2016-12-29 MED ORDER — HYDROCODONE-ACETAMINOPHEN 5-325 MG PO TABS: 1.0000 | ORAL_TABLET | Freq: Four times a day (QID) | ORAL | 0 refills | Status: DC | PRN

## 2016-12-29 MED ORDER — ROCURONIUM BROMIDE 50 MG/5ML IV SOSY
PREFILLED_SYRINGE | INTRAVENOUS | Status: AC
  Filled 2016-12-29: qty 5

## 2016-12-29 MED ORDER — GENTAMICIN IN SALINE 1.6-0.9 MG/ML-% IV SOLN
80.0000 mg | INTRAVENOUS | Status: AC
  Administered 2016-12-29: 80 mg via INTRAVENOUS
  Filled 2016-12-29: qty 50

## 2016-12-29 SURGICAL SUPPLY — 31 items
BAG DRAIN CYSTO-URO LG1000N (MISCELLANEOUS) ×2 IMPLANT
BASKET ZERO TIP 1.9FR (BASKET) IMPLANT
CATH URETL 5X70 OPEN END (CATHETERS) IMPLANT
CNTNR SPEC 2.5X3XGRAD LEK (MISCELLANEOUS) ×1
CONRAY 43 FOR UROLOGY 50M (MISCELLANEOUS) ×2 IMPLANT
CONT SPEC 4OZ STER OR WHT (MISCELLANEOUS) ×1
CONTAINER SPEC 2.5X3XGRAD LEK (MISCELLANEOUS) ×1 IMPLANT
DRAPE UTILITY 15X26 TOWEL STRL (DRAPES) ×2 IMPLANT
FIBER LASER LITHO 273 (Laser) IMPLANT
GLOVE BIO SURGEON STRL SZ 6.5 (GLOVE) ×4 IMPLANT
GOWN STRL REUS W/ TWL LRG LVL3 (GOWN DISPOSABLE) IMPLANT
GOWN STRL REUS W/ TWL LRG LVL4 (GOWN DISPOSABLE) ×2 IMPLANT
GOWN STRL REUS W/TWL LRG LVL3 (GOWN DISPOSABLE)
GOWN STRL REUS W/TWL LRG LVL4 (GOWN DISPOSABLE) ×2
GUIDEWIRE GREEN .038 145CM (MISCELLANEOUS) ×2 IMPLANT
INFUSOR MANOMETER BAG 3000ML (MISCELLANEOUS) ×2 IMPLANT
INTRODUCER DILATOR DOUBLE (INTRODUCER) ×2 IMPLANT
KIT RM TURNOVER CYSTO AR (KITS) ×2 IMPLANT
PACK CYSTO AR (MISCELLANEOUS) ×2 IMPLANT
SCRUB POVIDONE IODINE 4 OZ (MISCELLANEOUS) IMPLANT
SENSORWIRE 0.038 NOT ANGLED (WIRE)
SET CYSTO W/LG BORE CLAMP LF (SET/KITS/TRAYS/PACK) ×2 IMPLANT
SHEATH URETERAL 12FRX35CM (MISCELLANEOUS) IMPLANT
SOL .9 NS 3000ML IRR  AL (IV SOLUTION) ×2
SOL .9 NS 3000ML IRR UROMATIC (IV SOLUTION) ×2 IMPLANT
STENT URET 6FRX24 CONTOUR (STENTS) IMPLANT
STENT URET 6FRX26 CONTOUR (STENTS) ×2 IMPLANT
SURGILUBE 2OZ TUBE FLIPTOP (MISCELLANEOUS) ×2 IMPLANT
SYRINGE IRR TOOMEY STRL 70CC (SYRINGE) ×2 IMPLANT
WATER STERILE IRR 1000ML POUR (IV SOLUTION) ×2 IMPLANT
WIRE SENSOR 0.038 NOT ANGLED (WIRE) IMPLANT

## 2016-12-29 NOTE — Anesthesia Post-op Follow-up Note (Cosign Needed)
Anesthesia QCDR form completed.        

## 2016-12-29 NOTE — Transfer of Care (Signed)
Immediate Anesthesia Transfer of Care Note  Patient: Edward Petersen  Procedure(s) Performed: Procedure(s): URETEROSCOPY WITH HOLMIUM LASER LITHOTRIPSY (Bilateral) CYSTOSCOPY WITH STENT REPLACEMENT (Bilateral)  Patient Location: PACU  Anesthesia Type:General  Level of Consciousness: awake  Airway & Oxygen Therapy: Patient Spontanous Breathing  Post-op Assessment: Report given to RN  Post vital signs: stable  Last Vitals:  Vitals:   12/29/16 0800 12/29/16 1104  BP: 125/88 (!) 148/89  Pulse: 66 69  Resp: 18 13  Temp: 36.7 C (!) 36.1 C    Last Pain:  Vitals:   12/29/16 0800  TempSrc: Oral         Past Medical History:  Diagnosis Date  . Bell's palsy 2000   resolved except tinnitus in right ear.  . Chickenpox   . Complication of anesthesia   . Gout   . History of kidney stones   . Osteoarthritis    both wrists  . PONV (postoperative nausea and vomiting) 2000   shoulder surgery  . Traumatic injury of head    Past Surgical History:  Procedure Laterality Date  . COLONOSCOPY    . COLONOSCOPY WITH PROPOFOL N/A 11/30/2015   Procedure: COLONOSCOPY WITH PROPOFOL;  Surgeon: Hulen Luster, MD;  Location: [facility] ENDOSCOPY;  Service: Gastroenterology;  Laterality: N/A;  . CYSTOSCOPY W/ RETROGRADES Bilateral 12/09/2016   Procedure: CYSTOSCOPY WITH RETROGRADE PYELOGRAM;  Surgeon: Alexis Frock, MD;  Location: [facility] ORS;  Service: Urology;  Laterality: Bilateral;  . CYSTOSCOPY WITH STENT PLACEMENT Bilateral 12/09/2016   Procedure: CYSTOSCOPY WITH STENT PLACEMENT;  Surgeon: Alexis Frock, MD;  Location: [facility] ORS;  Service: Urology;  Laterality: Bilateral;  . left shoulder rotator cuff repair    . LITHOTRIPSY    . topaz of right elbow     Scheduled Meds: . famotidine       Continuous Infusions: . lactated ringers 50 mL/hr at 12/29/16 0816   PRN Meds:.   Complications: No apparent anesthesia complications

## 2016-12-29 NOTE — Anesthesia Preprocedure Evaluation (Addendum)
Anesthesia Evaluation  Patient identified by MRN, date of birth, ID band Patient awake    Reviewed: Allergy & Precautions, NPO status , Patient's Chart, lab work & pertinent test results, reviewed documented beta blocker date and time   Airway Mallampati: III  TM Distance: >3 FB     Dental  (+) Chipped   Pulmonary           Cardiovascular      Neuro/Psych  Neuromuscular disease    GI/Hepatic   Endo/Other    Renal/GU ARFRenal disease     Musculoskeletal  (+) Arthritis ,   Abdominal   Peds  (+) NICU stay Hematology   Anesthesia Other Findings Gout. Tinnitus on L. Pt. Denies hx of TBI. Bells resolved. No seizures.  Reproductive/Obstetrics                            Anesthesia Physical Anesthesia Plan  ASA: III  Anesthesia Plan: General   Post-op Pain Management:    Induction: Intravenous  Airway Management Planned: Oral ETT and LMA  Additional Equipment:   Intra-op Plan:   Post-operative Plan:   Informed Consent: I have reviewed the patients History and Physical, chart, labs and discussed the procedure including the risks, benefits and alternatives for the proposed anesthesia with the patient or authorized representative who has indicated his/her understanding and acceptance.     Plan Discussed with: CRNA  Anesthesia Plan Comments:         Anesthesia Quick Evaluation

## 2016-12-29 NOTE — Interval H&P Note (Signed)
History and Physical Interval Note:  12/29/2016 8:34 AM  Edward Petersen  has presented today for surgery, with the diagnosis of BILATERAL URETERAL STONES  The various methods of treatment have been discussed with the patient and family. After consideration of risks, benefits and other options for treatment, the patient has consented to  Procedure(s): URETEROSCOPY WITH HOLMIUM LASER LITHOTRIPSY (Left) CYSTOSCOPY WITH STENT REPLACEMENT (Left) as a surgical intervention .  The patient's history has been reviewed, patient examined, no change in status, stable for surgery.  I have reviewed the patient's chart and labs.  Questions were answered to the patient's satisfaction.     Hollice Espy

## 2016-12-29 NOTE — Anesthesia Postprocedure Evaluation (Signed)
Anesthesia Post Note  Patient: Edward Petersen  Procedure(s) Performed: Procedure(s) (LRB): URETEROSCOPY WITH HOLMIUM LASER LITHOTRIPSY (Bilateral) CYSTOSCOPY WITH STENT REPLACEMENT (Bilateral)  Patient location during evaluation: PACU Anesthesia Type: General Level of consciousness: awake and alert Pain management: pain level controlled Vital Signs Assessment: post-procedure vital signs reviewed and stable Respiratory status: spontaneous breathing, nonlabored ventilation, respiratory function stable and patient connected to nasal cannula oxygen Cardiovascular status: blood pressure returned to baseline and stable Postop Assessment: no signs of nausea or vomiting Anesthetic complications: no     Last Vitals:  Vitals:   12/29/16 1146 12/29/16 1200  BP: 129/79 122/74  Pulse: 65 66  Resp: 16   Temp: 36.3 C     Last Pain:  Vitals:   12/29/16 1146  TempSrc: Temporal  PainSc: 0-No pain                 Lynette Topete S

## 2016-12-29 NOTE — Anesthesia Procedure Notes (Signed)
Procedure Name: Intubation Date/Time: 12/29/2016 9:14 AM Performed by: Zetta Bills Pre-anesthesia Checklist: Patient identified, Emergency Drugs available, Suction available and Patient being monitored Patient Re-evaluated:Patient Re-evaluated prior to inductionOxygen Delivery Method: Circle system utilized Preoxygenation: Pre-oxygenation with 100% oxygen Intubation Type: IV induction Ventilation: Mask ventilation without difficulty Laryngoscope Size: Mac and 4 Grade View: Grade I Tube type: Oral Tube size: 7.0 mm Number of attempts: 1 Airway Equipment and Method: Stylet Placement Confirmation: ETT inserted through vocal cords under direct vision Secured at: 21 cm Tube secured with: Tape Dental Injury: Teeth and Oropharynx as per pre-operative assessment

## 2016-12-29 NOTE — Op Note (Signed)
Date of procedure: 12/29/16  Preoperative diagnosis:  1. Ureteral calculi, bilateral 2. Acute on chronic renal failure 3. Bilateral non obstructing nephrolithisis  Postoperative diagnosis:  1. same  Procedure: 1. Bilateral ureteroscopy, laser lithotripsy 2. Bilateral retrograde pyelogram 3. Bilateral ureteral stent exchange 4. Basket extraction of stone fragment  Surgeon: Hollice Espy, MD  Anesthesia: General  Complications: None  Intraoperative findings: Large bilateral obstructing ureteral calculi, 2 stones on right and 1 on the left treated  EBL: minimal  Specimens: stone fragment  Drains: 6 x 26 Fr JJ French ureteral stent bilaterally  Indication: Edward Petersen is a 66 y.o. patient with bilateral obstructing ureteral calculi status post bilateral ureteral stent placement return safer definitive management of his stones..  After reviewing the management options for treatment, he elected to proceed with the above surgical procedure(s). We have discussed the potential benefits and risks of the procedure, side effects of the proposed treatment, the likelihood of the patient achieving the goals of the procedure, and any potential problems that might occur during the procedure or recuperation. Informed consent has been obtained.  Description of procedure:  The patient was taken to the operating room and general anesthesia was induced.  The patient was placed in the dorsal lithotomy position, prepped and draped in the usual sterile fashion, and preoperative antibiotics were administered. A preoperative time-out was performed.   23 French the scope was advanced per urethra into the bladder. The distal coils of the 2 indwelling ureteral stents were seen both directly visually and under fluoroscopy. There was some entanglement but ultimately was able to differentiate the left and the right. The distal coil the left was then grasped using stent graspers about the level of the  urethral meatus. The stent was then cannulated using a sensor wire up to level of the kidney. The stent was removed leaving the wire in place. A second Super Stiff wire was then introduced into the lumen catheter just within the distal ureter under fluoroscopic guidance. A semirigid ureteroscope was then brought it up to the level of the stone. The stone appeared to be larger than previous identified on the CT scan, estimated size proximal 8 mm and spherical in nature. A 270  laser fiber was then brought in and using the settings of 0.8 J and 10 Hz, the stone was fragmented into proximally 20-30 smaller pieces. Each of these pieces were then dissected using a 1.9 [place] to plus nitinol basket. Once the ureter was completely cleared of all stone and stone debris, the Super Stiff wire in ureteroscope were removed. The sensor wire was snapped in place as a safety wire to be used later at the end of the procedure for stent placement.  Attention was then turned to the right ureteral orifice. The same technique was used, grasping the distal coil of the stent and bring it to level of the urethral meatus. The sensor wire was then advanced up to level of the kidney under fluoroscopic guidance. The wire was then snapped in place as a safety wire. The scope was then advanced to the distal ureter where  2 stones were encountered.  The laser fiber was then used to break up the stones as well. A nitinol basket was used to extract each of the pieces.  Once the ureter was clear, the scope was removed.  Finally, each of the safety wires are backloaded over the rigid cystoscope in a 6 x 26 French double-J ureteral stent was placed up to level of  each kidney. The wire was partially removed until coil was noted within each renal pelvis. The wire was then fully removed removed until a coil was noted within the bladder bilaterally. The bladder was then drained. A good amount of the stone material was passed off the field as stone  specimen. The patient was then cleaned and dried, repositioned the supine position, reversed from anesthesia, and taken to the PACU in stable condition.  Plan: Patient will follow-up in 1 week for possible cystoscopy, stent removal. Alternatively, we could discuss returning to the operating room for staged bilateral ureteroscopy to treat his nonobstructng upper tract stones. Given the amount of the stone burden addressed today, did not feel that further upper tract treatment was possible given the total operative time and visualization.   Hollice Espy, M.D.

## 2016-12-29 NOTE — Discharge Instructions (Addendum)
You have a ureteral stent in place.  This is a tube that extends from your kidney to your bladder.  This may cause urinary bleeding, burning with urination, and urinary frequency.  Please call our office or present to the ED if you develop fevers >101 or pain which is not able to be controlled with oral pain medications.  You may be given either Flomax and/ or ditropan to help with bladder spasms and stent pain in addition to pain medications.    [facility] [address] [place], [place] [address] [phone]  AMBULATORY SURGERY  DISCHARGE INSTRUCTIONS   1) The drugs that you were given will stay in your system until tomorrow so for the next 24 hours you should not:  A) Drive an automobile B) Make any legal decisions C) Drink any alcoholic beverage   2) You may resume regular meals tomorrow.  Today it is better to start with liquids and gradually work up to solid foods.  You may eat anything you prefer, but it is better to start with liquids, then soup and crackers, and gradually work up to solid foods.   3) Please notify your doctor immediately if you have any unusual bleeding, trouble breathing, redness and pain at the surgery site, drainage, fever, or pain not relieved by medication.    4) Additional Instructions:  Force fluids .   Please contact your physician with any problems or Same Day Surgery at [phone], Monday through Friday 6 am to 4 pm, or [place] at [facility] number at [phone].

## 2016-12-29 NOTE — H&P (View-Only) (Signed)
12/17/2016 6:48 PM   Star Age Jun 27, 1951 [id number]  Referring provider: Derinda Late, MD [phone] [facility] [place], [place] [address]  Chief Complaint  Patient presents with  . Nephrolithiasis    discuss surgery    HPI: 66 year old male who presents today for follow-up from recent admission for bilateral ureteral stones requiring ureteral stent placement.  1 - Bilateral Ureteral Stones - Rt mid 80mm stone with intrarenal stones 69mm x 4 and Lt distal 53mm stone with intrarenal stones 58mm x 5 by ER CT 12/2016 on eval flank pain. S/p bilateral ureteral stent placement on 12/09/16 by Dr. Phebe Colla.  All stones approx 750HU. One prior stone managed with SWL many years ago.   2 - Acute Renal Failure - Cr 1.88 by ER labs on admission. Unknown baseline, but denies prior renal insuficiency.  Improved to 1.10 by discharge on POD1.     3 - Enlarging Left Renal Cyst - 8cm left lower pole cyst with some early mass effect by CT 12/2016, was 4cm 2006. No prior contrast imaging to r/o enhancement.    PMH: Past Medical History:  Diagnosis Date  . Bell's palsy   . Chickenpox   . Gout   . Hypercholesteremia   . Kidney stones   . Osteoarthritis   . Traumatic injury of head     Surgical History: Past Surgical History:  Procedure Laterality Date  . COLONOSCOPY    . COLONOSCOPY WITH PROPOFOL N/A 11/30/2015   Procedure: COLONOSCOPY WITH PROPOFOL;  Surgeon: Hulen Luster, MD;  Location: [facility] ENDOSCOPY;  Service: Gastroenterology;  Laterality: N/A;  . CYSTOSCOPY W/ RETROGRADES Bilateral 12/09/2016   Procedure: CYSTOSCOPY WITH RETROGRADE PYELOGRAM;  Surgeon: Alexis Frock, MD;  Location: [facility] ORS;  Service: Urology;  Laterality: Bilateral;  . CYSTOSCOPY WITH STENT PLACEMENT Bilateral 12/09/2016   Procedure: CYSTOSCOPY WITH STENT PLACEMENT;  Surgeon: Alexis Frock, MD;  Location: [facility] ORS;  Service: Urology;  Laterality: Bilateral;  . left shoulder  rotator cuff repair    . LITHOTRIPSY    . topaz of right elbow      Home Medications:  Allergies as of 12/17/2016      Reactions   Primidone Nausea Only      Medication List       Accurate as of 12/17/16 11:59 PM. Always use your most recent med list.          acetaminophen 500 MG tablet Commonly known as:  TYLENOL Take 500 mg by mouth every 6 (six) hours as needed.   [employer] EAR DROPS OT Place 4 drops into the right eye 4 (four) times daily.   docusate sodium 100 MG capsule Commonly known as:  COLACE Take 1 capsule (100 mg total) by mouth 2 (two) times daily. While on pain medications   ibuprofen 200 MG tablet Commonly known as:  ADVIL,MOTRIN Take 200 mg by mouth every 6 (six) hours as needed.   multivitamin with minerals Tabs tablet Take 1 tablet by mouth daily.   omega-3 acid ethyl esters 1 g capsule Commonly known as:  LOVAZA Take 1 g by mouth daily.   oxybutynin 10 MG 24 hr tablet Commonly known as:  DITROPAN XL Take 1 tablet (10 mg total) by mouth at bedtime. X 2 weeks   VITAMIN D PO Take 1 tablet by mouth daily.       Allergies:  Allergies  Allergen Reactions  . Primidone Nausea Only    Family  History: Family History  Problem Relation Age of Onset  . Mesothelioma Father   . Pancreatic cancer Brother     Social History:  reports that he has never smoked. He has never used smokeless tobacco. He reports that he does not drink alcohol or use drugs.  ROS: UROLOGY Frequent Urination?: No Hard to postpone urination?: No Burning/pain with urination?: Yes Get up at night to urinate?: No Leakage of urine?: No Urine stream starts and stops?: No Trouble starting stream?: No Do you have to strain to urinate?: No Blood in urine?: No Urinary tract infection?: No Sexually transmitted disease?: No Injury to kidneys or bladder?: No Painful intercourse?: No Weak stream?: No Erection problems?: No Penile pain?: No  Gastrointestinal Nausea?:  No Vomiting?: No Indigestion/heartburn?: No Diarrhea?: No Constipation?: No  Constitutional Fever: No Night sweats?: Yes Weight loss?: No Fatigue?: No  Skin Skin rash/lesions?: No Itching?: No  Eyes Blurred vision?: Yes Double vision?: No  Ears/Nose/Throat Sore throat?: No Sinus problems?: No  Hematologic/Lymphatic Swollen glands?: No Easy bruising?: No  Cardiovascular Leg swelling?: No Chest pain?: No  Respiratory Cough?: No Shortness of breath?: No  Endocrine Excessive thirst?: No  Musculoskeletal Back pain?: No Joint pain?: No  Neurological Headaches?: No Dizziness?: No  Psychologic Depression?: No Anxiety?: No  Physical Exam: BP (!) 141/84   Pulse 76   Ht 5\' 9"  (1.753 m)   Wt 199 lb (90.3 kg)   BMI 29.39 kg/m   Constitutional:  Alert and oriented, No acute distress. HEENT: [place] AT, moist mucus membranes.  Trachea midline, no masses. Cardiovascular: No clubbing, cyanosis, or edema. RRR. Respiratory: Normal respiratory effort, no increased work of breathing.  CTAB. GI: Abdomen is soft, nontender, nondistended, no abdominal masses GU: No CVA tenderness.  Skin: No rashes, bruises or suspicious lesions. Neurologic: Grossly intact, no focal deficits, moving all 4 extremities. Psychiatric: Normal mood and affect.  Laboratory Data: Lab Results  Component Value Date   WBC 9.5 12/10/2016   HGB 13.6 12/10/2016   HCT 38.2 (L) 12/10/2016   MCV 90.1 12/10/2016   PLT 241 12/10/2016    Lab Results  Component Value Date   CREATININE 1.10 12/10/2016     Urinalysis    Component Value Date/Time   COLORURINE YELLOW (A) 12/09/2016 0927   APPEARANCEUR Hazy (A) 12/17/2016 1402   LABSPEC 1.016 12/09/2016 0927   PHURINE 5.0 12/09/2016 0927   GLUCOSEU Negative 12/17/2016 1402   HGBUR LARGE (A) 12/09/2016 0927   BILIRUBINUR Negative 12/17/2016 1402   KETONESUR 5 (A) 12/09/2016 0927   PROTEINUR Trace (A) 12/17/2016 1402   PROTEINUR 30 (A)  12/09/2016 0927   NITRITE Negative 12/17/2016 1402   NITRITE NEGATIVE 12/09/2016 0927   LEUKOCYTESUR Trace (A) 12/17/2016 1402    Pertinent Imaging: Study Result   CLINICAL DATA:  Left flank pain for 5 days  EXAM: CT ABDOMEN AND PELVIS WITHOUT CONTRAST  TECHNIQUE: Multidetector CT imaging of the abdomen and pelvis was performed following the standard protocol without oral or intravenous contrast material administration.  COMPARISON:  June 21, 2011  FINDINGS: Lower chest: Lung bases are clear.  There is a small hiatal hernia.  Hepatobiliary: There is a focal area of decreased attenuation in the anterior segment of the right lobe of the liver measuring 1.6 x 1.1 cm, stable from prior study and likely benign hemangioma. There is a 7 mm probable cyst near the fissure for the ligamentum teres in the right lobe of the liver anteriorly, stable. No new liver  lesions are evident on this noncontrast enhanced study. There is apparent sludge in the gallbladder with possible noncalcified gallstones. There is no gallbladder wall thickening. There is no biliary duct dilatation.  Pancreas: There is no pancreatic mass or inflammatory focus.  Spleen: No splenic lesions are evident.  Adrenals/Urinary Tract: The right adrenal is unremarkable. There is an 8 x 8 mm apparent adenoma in the left adrenal. There is a cyst arising from the lower pole of the left kidney measuring 8.0 x 6.2 x 6.3 cm. There is moderate hydronephrosis on the left. There is no hydronephrosis on the right. On the right, there are multiple calculi throughout the kidney. The largest calculus in the right kidney is in the upper pole region measuring 1.3 x 0.8 cm. Other calculi range in size from as small as 3 mm to as large as 9 mm. Multiple calculi are noted in the left kidney ranging in size from as small as 1 mm to as large as 8 mm. There is subtle edema involving the left kidney. On the left, there is a  calculus at the level of the mid sacrum in the left ureter measuring 1.1 x 0.6 cm. On the right, there is a calculus at the level of L4-5 measuring 7 x 7 mm which is not causing appreciable hydronephrosis. Note, however, that there is mild mesenteric thickening and fluid near this ureteral calculi on the right as well as fluid tracking into the right lateral mid pelvis. No other ureteral calculi identified. Urinary bladder is midline with wall thickness within normal limits.  Stomach/Bowel: There is no bowel wall or mesenteric thickening. No bowel obstruction. No free air or portal venous air.  Vascular/Lymphatic: There is no appreciable abdominal aortic aneurysm ; the aorta is mildly tortuous in the abdominal region. No appreciable vascular calcification is evident. There is no appreciable adenopathy in the abdomen or pelvis.  Reproductive: Prostate appears rather prominent. Contains multiple calculi. The prostate impresses on the inferior aspect of the urinary bladder. Seminal vesicles appear normal. Apart from the prostate, there is no pelvic mass.  Other: There is no periappendiceal region inflammation. No abscess or ascites evident. There is fat in each inguinal ring.  Musculoskeletal: There is a hemangioma in the L3 vertebral body, also present on prior study. There is focal disc narrowing at L3-4. There is a stable sclerotic focus in the left iliac crest, a possible bone island. No new sclerotic foci identified compared to prior study. No lytic or destructive bone lesions evident. There is no abdominal wall or intramuscular lesion.  IMPRESSION: There is a ureteral calculus at the mid sacral level on the left measuring 1.1 x 0.6 cm which is causing moderate hydronephrosis and perinephric edema peer  On the right there is a calculus at the L4-5 level measuring 7 x 7 mm with localized. Localized periureteral inflammation but no appreciable hydronephrosis on the  right.  There are multiple intrarenal calculi, slightly more on the right than on the left.  Prostate is enlarged and abuts the inferior urinary bladder. There are multiple prostatic calculi. Advise correlation with PSA.  Small left adrenal adenoma, a benign finding.  Sludge and possible nonshadowing calculi in gallbladder. Gallbladder wall not thickened.  Small hiatal hernia.  No bowel obstruction. No abscess. No periappendiceal region inflammatory change.  Hemangioma L3 vertebral body.  Bone island left iliac crest, stable.   Electronically Signed   By: Lowella Grip III M.D.   On: 12/09/2016 09:54   CT scan  reviewed with the patient and personally today.  Assessment & Plan:    1. Bilateral ureteral stones S/p bilateral ureteral stent placement Given that he has bilateral ureteral stents, I would recommend ureteroscopy to clear his obstructing stone burden. Risks and benefits of ureteroscopy were reviewed including but not limited to infection, bleeding, pain, ureteral injury which could require open surgery versus prolonged indwelling if ureteralperforation occurs, persistent stone disease, requirement for staged procedure, possible stent, and global anesthesia risks. Patient expressed understanding and desires to proceed with ureteroscopy. Preop urine culture He will likely require further stone surgery to treat his nonobstructing stone burden in the future along with stone analysis and metatbolic work up - CULTURE, URINE COMPREHENSIVE - Urinalysis, Complete  2. Acute renal failure, unspecified acute renal failure type ([place]) 2/2 bilateral obstruction s/p stent  3. Renal cyst, left Enlarging, asymptomatic. Will assess further with post ureteroscopic renal ultrasound.  Schedule bilateral ureteroscopy   Hollice Espy, MD  [facility] [address], [place], [place] [address] [phone]

## 2017-01-01 ENCOUNTER — Telehealth: Payer: Self-pay

## 2017-01-01 NOTE — Telephone Encounter (Signed)
-----   Message from Alexis Frock, MD sent at 12/09/2016  5:49 PM EST ----- Regarding: schedule surgery This guy needs bilateral ureteroscopy with laser lithotripsy in 2-3 weeks if possible. 90 minutes OR time. Rocephin 1gm IV peri-op.  Seen as urgent consult and had bilateral stents placed 12/09/16.  Feel free to fwd to Amy.  Thanks, T [place]

## 2017-01-01 NOTE — Telephone Encounter (Signed)
The pt had his surgery on 12/29/16 by Dr. Erlene Quan.

## 2017-01-05 ENCOUNTER — Encounter: Payer: Self-pay | Admitting: Urology

## 2017-01-05 LAB — STONE ANALYSIS
Ca Oxalate,Monohydr.: 95 %
Ca phos cry stone ql IR: 5 %
STONE WEIGHT KSTONE: 58.7 mg

## 2017-01-06 ENCOUNTER — Encounter: Payer: Self-pay | Admitting: Urology

## 2017-01-06 ENCOUNTER — Ambulatory Visit: Payer: Managed Care, Other (non HMO) | Admitting: Urology

## 2017-01-06 VITALS — BP 133/80 | HR 75 | Ht 69.0 in | Wt 195.0 lb

## 2017-01-06 DIAGNOSIS — N200 Calculus of kidney: Secondary | ICD-10-CM | POA: Diagnosis not present

## 2017-01-06 DIAGNOSIS — N281 Cyst of kidney, acquired: Secondary | ICD-10-CM | POA: Diagnosis not present

## 2017-01-06 DIAGNOSIS — N201 Calculus of ureter: Secondary | ICD-10-CM | POA: Diagnosis not present

## 2017-01-06 NOTE — Progress Notes (Signed)
01/06/2017 4:39 PM   Edward Petersen 66-Apr-1952 [id number]  Referring provider: Derinda Late, MD [phone] [facility] [place], [place] [address]  Chief Complaint  Patient presents with  . Cysto Stent Removal    HPI: 66 year old male who presents today for follow-up after ureteroscopy on 12/29/16 for treatment of his bilateral ureteral stones.  1 - Bilateral Ureteral Stones - Rt mid 42mm stone with intrarenal stones 6mm x 4 and Lt distal 20mm stone with intrarenal stones 64mm x 5 by ER CT 12/2016 on eval flank pain. S/p bilateral ureteral stent placement on 12/09/16 by Dr. Phebe Colla.  All stones approx 750HU. One prior stone managed with SWL many years ago.  S/p B URS on 12/29/16.  Tolerating stents well, some hematuria with activity and flank pain with voiding.  No fevers of chills.    2 - Acute Renal Failure - Cr 1.88 by ER labs on admission. Unknown baseline, but denies prior renal insuficiency.  Improved to 1.10 by discharge on POD1.     3 - Enlarging Left Renal Cyst - 8cm left lower pole cyst with some early mass effect by CT 12/2016, was 4cm 2006. No prior contrast imaging to r/o enhancement.    PMH: Past Medical History:  Diagnosis Date  . Bell's palsy 2000   resolved except tinnitus in right ear.  . Chickenpox   . Complication of anesthesia   . Gout   . History of kidney stones   . Osteoarthritis    both wrists  . PONV (postoperative nausea and vomiting) 2000   shoulder surgery  . Traumatic injury of head     Surgical History: Past Surgical History:  Procedure Laterality Date  . COLONOSCOPY    . COLONOSCOPY WITH PROPOFOL N/A 11/30/2015   Procedure: COLONOSCOPY WITH PROPOFOL;  Surgeon: Hulen Luster, MD;  Location: [facility] ENDOSCOPY;  Service: Gastroenterology;  Laterality: N/A;  . CYSTOSCOPY W/ RETROGRADES Bilateral 12/09/2016   Procedure: CYSTOSCOPY WITH RETROGRADE PYELOGRAM;  Surgeon: Alexis Frock, MD;  Location: [facility] ORS;   Service: Urology;  Laterality: Bilateral;  . CYSTOSCOPY W/ URETERAL STENT PLACEMENT Bilateral 12/29/2016   Procedure: CYSTOSCOPY WITH STENT REPLACEMENT;  Surgeon: Hollice Espy, MD;  Location: [facility] ORS;  Service: Urology;  Laterality: Bilateral;  . CYSTOSCOPY WITH STENT PLACEMENT Bilateral 12/09/2016   Procedure: CYSTOSCOPY WITH STENT PLACEMENT;  Surgeon: Alexis Frock, MD;  Location: [facility] ORS;  Service: Urology;  Laterality: Bilateral;  . left shoulder rotator cuff repair    . LITHOTRIPSY    . topaz of right elbow    . URETEROSCOPY WITH HOLMIUM LASER LITHOTRIPSY Bilateral 12/29/2016   Procedure: URETEROSCOPY WITH HOLMIUM LASER LITHOTRIPSY;  Surgeon: Hollice Espy, MD;  Location: [facility] ORS;  Service: Urology;  Laterality: Bilateral;    Home Medications:  Allergies as of 01/06/2017      Reactions   Primidone Nausea Only, Other (See Comments)   Suicidal thoughts      Medication List       Accurate as of 01/06/17  4:39 PM. Always use your most recent med list.          acetaminophen 500 MG tablet Commonly known as:  TYLENOL Take 500 mg by mouth every 6 (six) hours as needed.   [employer] EAR DROPS OT Place 4 drops into the right eye 4 (four) times daily. As needed   docusate sodium 100 MG capsule Commonly known as:  COLACE Take 1 capsule (100 mg total) by  mouth 2 (two) times daily. While on pain medications   ibuprofen 200 MG tablet Commonly known as:  ADVIL,MOTRIN Take 200 mg by mouth every 6 (six) hours as needed.   multivitamin with minerals Tabs tablet Take 1 tablet by mouth daily.   omega-3 acid ethyl esters 1 g capsule Commonly known as:  LOVAZA Take 1 g by mouth daily.   oxybutynin 10 MG 24 hr tablet Commonly known as:  DITROPAN XL Take 1 tablet (10 mg total) by mouth at bedtime. X 2 weeks   tamsulosin 0.4 MG Caps capsule Commonly known as:  FLOMAX Take 1 capsule (0.4 mg total) by mouth daily.   VITAMIN D PO Take 1 tablet by mouth daily.       Allergies:    Allergies  Allergen Reactions  . Primidone Nausea Only and Other (See Comments)    Suicidal thoughts    Family History: Family History  Problem Relation Petersen of Onset  . Mesothelioma Father   . Pancreatic cancer Brother     Social History:  reports that he has never smoked. He has never used smokeless tobacco. He reports that he does not drink alcohol or use drugs.  ROS: 12 point ROS negative other than as per HPI  Physical Exam: BP 133/80   Pulse 75   Ht 5\' 9"  (1.753 m)   Wt 195 lb (88.5 kg)   BMI 28.80 kg/m   Constitutional:  Alert and oriented, No acute distress. HEENT: [place] AT, moist mucus membranes.  Trachea midline, no masses. Cardiovascular: No clubbing, cyanosis, or edema. Respiratory: Normal respiratory effort, no increased work of breathing.   GI: Abdomen is soft, nontender, nondistended, no abdominal masses GU: No CVA tenderness.  Skin: No rashes, bruises or suspicious lesions. Neurologic: Grossly intact, no focal deficits, moving all 4 extremities. Psychiatric: Normal mood and affect.  Laboratory Data: Lab Results  Component Value Date   WBC 9.5 12/10/2016   HGB 13.6 12/10/2016   HCT 38.2 (L) 12/10/2016   MCV 90.1 12/10/2016   PLT 241 12/10/2016    Lab Results  Component Value Date   CREATININE 1.10 12/10/2016    Urinalysis    Component Value Date/Time   COLORURINE YELLOW (A) 12/09/2016 0927   APPEARANCEUR Hazy (A) 12/17/2016 1402   LABSPEC 1.016 12/09/2016 0927   PHURINE 5.0 12/09/2016 0927   GLUCOSEU Negative 12/17/2016 1402   HGBUR LARGE (A) 12/09/2016 0927   BILIRUBINUR Negative 12/17/2016 1402   KETONESUR 5 (A) 12/09/2016 0927   PROTEINUR Trace (A) 12/17/2016 1402   PROTEINUR 30 (A) 12/09/2016 0927   NITRITE Negative 12/17/2016 1402   NITRITE NEGATIVE 12/09/2016 0927   LEUKOCYTESUR Trace (A) 12/17/2016 1402    Pertinent Imaging: Study Result   CLINICAL DATA:  Left flank pain for 5 days  EXAM: CT ABDOMEN AND PELVIS WITHOUT  CONTRAST  TECHNIQUE: Multidetector CT imaging of the abdomen and pelvis was performed following the standard protocol without oral or intravenous contrast material administration.  COMPARISON:  June 21, 2011  FINDINGS: Lower chest: Lung bases are clear.  There is a small hiatal hernia.  Hepatobiliary: There is a focal area of decreased attenuation in the anterior segment of the right lobe of the liver measuring 1.6 x 1.1 cm, stable from prior study and likely benign hemangioma. There is a 7 mm probable cyst near the fissure for the ligamentum teres in the right lobe of the liver anteriorly, stable. No new liver lesions are evident on this noncontrast enhanced study.  There is apparent sludge in the gallbladder with possible noncalcified gallstones. There is no gallbladder wall thickening. There is no biliary duct dilatation.  Pancreas: There is no pancreatic mass or inflammatory focus.  Spleen: No splenic lesions are evident.  Adrenals/Urinary Tract: The right adrenal is unremarkable. There is an 8 x 8 mm apparent adenoma in the left adrenal. There is a cyst arising from the lower pole of the left kidney measuring 8.0 x 6.2 x 6.3 cm. There is moderate hydronephrosis on the left. There is no hydronephrosis on the right. On the right, there are multiple calculi throughout the kidney. The largest calculus in the right kidney is in the upper pole region measuring 1.3 x 0.8 cm. Other calculi range in size from as small as 3 mm to as large as 9 mm. Multiple calculi are noted in the left kidney ranging in size from as small as 1 mm to as large as 8 mm. There is subtle edema involving the left kidney. On the left, there is a calculus at the level of the mid sacrum in the left ureter measuring 1.1 x 0.6 cm. On the right, there is a calculus at the level of L4-5 measuring 7 x 7 mm which is not causing appreciable hydronephrosis. Note, however, that there is mild mesenteric  thickening and fluid near this ureteral calculi on the right as well as fluid tracking into the right lateral mid pelvis. No other ureteral calculi identified. Urinary bladder is midline with wall thickness within normal limits.  Stomach/Bowel: There is no bowel wall or mesenteric thickening. No bowel obstruction. No free air or portal venous air.  Vascular/Lymphatic: There is no appreciable abdominal aortic aneurysm ; the aorta is mildly tortuous in the abdominal region. No appreciable vascular calcification is evident. There is no appreciable adenopathy in the abdomen or pelvis.  Reproductive: Prostate appears rather prominent. Contains multiple calculi. The prostate impresses on the inferior aspect of the urinary bladder. Seminal vesicles appear normal. Apart from the prostate, there is no pelvic mass.  Other: There is no periappendiceal region inflammation. No abscess or ascites evident. There is fat in each inguinal ring.  Musculoskeletal: There is a hemangioma in the L3 vertebral body, also present on prior study. There is focal disc narrowing at L3-4. There is a stable sclerotic focus in the left iliac crest, a possible bone island. No new sclerotic foci identified compared to prior study. No lytic or destructive bone lesions evident. There is no abdominal wall or intramuscular lesion.  IMPRESSION: There is a ureteral calculus at the mid sacral level on the left measuring 1.1 x 0.6 cm which is causing moderate hydronephrosis and perinephric edema peer  On the right there is a calculus at the L4-5 level measuring 7 x 7 mm with localized. Localized periureteral inflammation but no appreciable hydronephrosis on the right.  There are multiple intrarenal calculi, slightly more on the right than on the left.  Prostate is enlarged and abuts the inferior urinary bladder. There are multiple prostatic calculi. Advise correlation with PSA.  Small left adrenal adenoma,  a benign finding.  Sludge and possible nonshadowing calculi in gallbladder. Gallbladder wall not thickened.  Small hiatal hernia.  No bowel obstruction. No abscess. No periappendiceal region inflammatory change.  Hemangioma L3 vertebral body.  Bone island left iliac crest, stable.   Electronically Signed   By: Lowella Grip III M.D.   On: 12/09/2016 09:54   CT scan reviewed with the patient and personally today.  Assessment & Plan:    1. Bilateral ureteral stones Status post bilateral ureteroscopy for obstructing ureteral stones, well tolerated. Stents remain in place.  We discussed options today in detail for further management of his upper tract stones. Various options including bilateral ureteral stent removal today at bedside in expected management/observation of his stones versus staged bilateral ureteroscopy for treatment of his upper tract stones. Risk/ benefits of each were discussed.  After lengthy discussion today, he is most interested in returning back to the operating room. We discussed that we will start on his left side and attempt to clear him of the stone burden. I do feel that there is too much stone burden to attempt a bilateral ureteroscopy in the setting. He verbalized understanding of this. We did review his previous CT scan again today in detail.  Risks and benefits of ureteroscopy were reviewed including but not limited to infection, bleeding, pain, ureteral injury which could require open surgery versus prolonged indwelling if ureteralperforation occurs, persistent stone disease, requirement for staged procedure, possible stent, and global anesthesia risks. Patient expressed understanding and desires to proceed with ureteroscopy.  Preop urine culture  He will need  further stone surgery to treat his nonobstructing stone burden in the future along with stone analysis and metatbolic work up.  - CULTURE, URINE COMPREHENSIVE - Urinalysis,  Complete  2. Acute renal failure, unspecified acute renal failure type ([place]) 2/2 bilateral obstruction s/p stent  3. Renal cyst, left Enlarging, asymptomatic. Will assess further with post ureteroscopic renal ultrasound.  Schedule left ureteroscopy, LL, stent exchange  Hollice Espy, MD  [facility] [address], [place], [place] [address] [phone]

## 2017-01-07 ENCOUNTER — Telehealth: Payer: Self-pay | Admitting: Radiology

## 2017-01-07 ENCOUNTER — Other Ambulatory Visit: Payer: Self-pay | Admitting: Radiology

## 2017-01-07 DIAGNOSIS — N201 Calculus of ureter: Secondary | ICD-10-CM

## 2017-01-07 LAB — URINALYSIS, COMPLETE
Bilirubin, UA: NEGATIVE
GLUCOSE, UA: NEGATIVE
Ketones, UA: NEGATIVE
Nitrite, UA: NEGATIVE
Specific Gravity, UA: 1.025 (ref 1.005–1.030)
UUROB: 0.2 mg/dL (ref 0.2–1.0)
pH, UA: 6 (ref 5.0–7.5)

## 2017-01-07 LAB — MICROSCOPIC EXAMINATION
Epithelial Cells (non renal): NONE SEEN /hpf (ref 0–10)
RBC, UA: >30 /hpf — AB (ref 0–?)

## 2017-01-07 NOTE — Telephone Encounter (Signed)
Notified pt of surgery scheduled with Dr Erlene Quan on 01/13/17 & to call day prior to surgery for arrival time to SDS. Pt voices understanding.

## 2017-01-08 LAB — CULTURE, URINE COMPREHENSIVE

## 2017-01-12 MED ORDER — GENTAMICIN IN SALINE 1.6-0.9 MG/ML-% IV SOLN
80.0000 mg | INTRAVENOUS | Status: AC
  Administered 2017-01-13: 80 mg via INTRAVENOUS
  Filled 2017-01-12: qty 50

## 2017-01-12 MED ORDER — SODIUM CHLORIDE 0.9 % IV SOLN
1.0000 g | INTRAVENOUS | Status: AC
  Administered 2017-01-13: 1 g via INTRAVENOUS

## 2017-01-13 ENCOUNTER — Ambulatory Visit
Admission: RE | Admit: 2017-01-13 | Discharge: 2017-01-13 | Disposition: A | Discharge status: Home / Self Care | Payer: Managed Care, Other (non HMO) | Source: Ambulatory Visit | Attending: Urology | Admitting: Urology

## 2017-01-13 ENCOUNTER — Encounter
Admission: RE | Disposition: A | Discharge status: Home / Self Care | Payer: Self-pay | Source: Ambulatory Visit | Attending: Urology

## 2017-01-13 ENCOUNTER — Ambulatory Visit: Payer: Managed Care, Other (non HMO) | Admitting: Anesthesiology

## 2017-01-13 ENCOUNTER — Encounter: Payer: Self-pay | Admitting: *Deleted

## 2017-01-13 DIAGNOSIS — N201 Calculus of ureter: Secondary | ICD-10-CM

## 2017-01-13 DIAGNOSIS — N132 Hydronephrosis with renal and ureteral calculous obstruction: Secondary | ICD-10-CM | POA: Insufficient documentation

## 2017-01-13 DIAGNOSIS — M19031 Primary osteoarthritis, right wrist: Secondary | ICD-10-CM | POA: Diagnosis not present

## 2017-01-13 DIAGNOSIS — M19032 Primary osteoarthritis, left wrist: Secondary | ICD-10-CM | POA: Insufficient documentation

## 2017-01-13 DIAGNOSIS — Z888 Allergy status to other drugs, medicaments and biological substances status: Secondary | ICD-10-CM | POA: Insufficient documentation

## 2017-01-13 DIAGNOSIS — N281 Cyst of kidney, acquired: Secondary | ICD-10-CM | POA: Insufficient documentation

## 2017-01-13 DIAGNOSIS — Z87442 Personal history of urinary calculi: Secondary | ICD-10-CM | POA: Diagnosis not present

## 2017-01-13 DIAGNOSIS — N200 Calculus of kidney: Secondary | ICD-10-CM | POA: Diagnosis not present

## 2017-01-13 DIAGNOSIS — M109 Gout, unspecified: Secondary | ICD-10-CM | POA: Diagnosis not present

## 2017-01-13 HISTORY — PX: CYSTOSCOPY W/ URETERAL STENT PLACEMENT: SHX1429

## 2017-01-13 HISTORY — PX: URETEROSCOPY WITH HOLMIUM LASER LITHOTRIPSY: SHX6645

## 2017-01-13 SURGERY — URETEROSCOPY, WITH LITHOTRIPSY USING HOLMIUM LASER
Anesthesia: General | Laterality: Bilateral | Wound class: Clean Contaminated

## 2017-01-13 MED ORDER — LIDOCAINE HCL (PF) 2 % IJ SOLN
INTRAMUSCULAR | Status: AC
  Filled 2017-01-13: qty 2

## 2017-01-13 MED ORDER — DEXAMETHASONE SODIUM PHOSPHATE 10 MG/ML IJ SOLN
INTRAMUSCULAR | Status: AC
  Filled 2017-01-13: qty 1

## 2017-01-13 MED ORDER — SUGAMMADEX SODIUM 200 MG/2ML IV SOLN
INTRAVENOUS | Status: DC | PRN
  Administered 2017-01-13: 200 mg via INTRAVENOUS

## 2017-01-13 MED ORDER — LACTATED RINGERS IV SOLN
INTRAVENOUS | Status: DC | PRN
  Administered 2017-01-13 (×2): via INTRAVENOUS

## 2017-01-13 MED ORDER — MIDAZOLAM HCL 2 MG/2ML IJ SOLN
INTRAMUSCULAR | Status: AC
  Filled 2017-01-13: qty 2

## 2017-01-13 MED ORDER — FENTANYL CITRATE (PF) 100 MCG/2ML IJ SOLN
INTRAMUSCULAR | Status: AC
  Filled 2017-01-13: qty 2

## 2017-01-13 MED ORDER — GLYCOPYRROLATE 0.2 MG/ML IJ SOLN
INTRAMUSCULAR | Status: AC
  Filled 2017-01-13: qty 1

## 2017-01-13 MED ORDER — FENTANYL CITRATE (PF) 100 MCG/2ML IJ SOLN
INTRAMUSCULAR | Status: DC | PRN
Start: 2017-01-13 — End: 2017-01-13
  Administered 2017-01-13: 100 ug via INTRAVENOUS

## 2017-01-13 MED ORDER — PROPOFOL 10 MG/ML IV BOLUS
INTRAVENOUS | Status: DC | PRN
  Administered 2017-01-13: 200 mg via INTRAVENOUS

## 2017-01-13 MED ORDER — HYDROCODONE-ACETAMINOPHEN 5-325 MG PO TABS: 1.0000 | ORAL_TABLET | Freq: Four times a day (QID) | ORAL | 0 refills | Status: DC | PRN

## 2017-01-13 MED ORDER — GLYCOPYRROLATE 0.2 MG/ML IJ SOLN
INTRAMUSCULAR | Status: DC | PRN
  Administered 2017-01-13: 0.2 mg via INTRAVENOUS

## 2017-01-13 MED ORDER — ACETAMINOPHEN 10 MG/ML IV SOLN
INTRAVENOUS | Status: AC
Start: 2017-01-13 — End: ?
  Filled 2017-01-13: qty 100

## 2017-01-13 MED ORDER — PHENYLEPHRINE HCL 10 MG/ML IJ SOLN
INTRAMUSCULAR | Status: DC | PRN
  Administered 2017-01-13: 100 ug via INTRAVENOUS
  Administered 2017-01-13: 200 ug via INTRAVENOUS
  Administered 2017-01-13 (×2): 100 ug via INTRAVENOUS

## 2017-01-13 MED ORDER — ONDANSETRON HCL 4 MG/2ML IJ SOLN
INTRAMUSCULAR | Status: DC | PRN
  Administered 2017-01-13: 4 mg via INTRAVENOUS

## 2017-01-13 MED ORDER — ROCURONIUM BROMIDE 50 MG/5ML IV SOSY
PREFILLED_SYRINGE | INTRAVENOUS | Status: AC
  Filled 2017-01-13: qty 5

## 2017-01-13 MED ORDER — ONDANSETRON HCL 4 MG/2ML IJ SOLN
INTRAMUSCULAR | Status: AC
  Filled 2017-01-13: qty 2

## 2017-01-13 MED ORDER — FAMOTIDINE 20 MG PO TABS
20.0000 mg | ORAL_TABLET | Freq: Once | ORAL | Status: AC
  Administered 2017-01-13: 20 mg via ORAL

## 2017-01-13 MED ORDER — LACTATED RINGERS IV SOLN
INTRAVENOUS | Status: DC
  Administered 2017-01-13: 07:00:00 via INTRAVENOUS

## 2017-01-13 MED ORDER — SUGAMMADEX SODIUM 200 MG/2ML IV SOLN
INTRAVENOUS | Status: AC
  Filled 2017-01-13: qty 2

## 2017-01-13 MED ORDER — DEXAMETHASONE SODIUM PHOSPHATE 10 MG/ML IJ SOLN
INTRAMUSCULAR | Status: DC | PRN
  Administered 2017-01-13: 10 mg via INTRAVENOUS

## 2017-01-13 MED ORDER — PROPOFOL 10 MG/ML IV BOLUS
INTRAVENOUS | Status: AC
  Filled 2017-01-13: qty 20

## 2017-01-13 MED ORDER — ROCURONIUM BROMIDE 100 MG/10ML IV SOLN
INTRAVENOUS | Status: DC | PRN
  Administered 2017-01-13: 20 mg via INTRAVENOUS
  Administered 2017-01-13 (×2): 10 mg via INTRAVENOUS
  Administered 2017-01-13: 50 mg via INTRAVENOUS

## 2017-01-13 MED ORDER — FAMOTIDINE 20 MG PO TABS
ORAL_TABLET | ORAL | Status: AC
  Administered 2017-01-13: 20 mg via ORAL
  Filled 2017-01-13: qty 1

## 2017-01-13 MED ORDER — MIDAZOLAM HCL 2 MG/2ML IJ SOLN
INTRAMUSCULAR | Status: DC | PRN
Start: 2017-01-13 — End: 2017-01-13
  Administered 2017-01-13: 2 mg via INTRAVENOUS

## 2017-01-13 MED ORDER — SEVOFLURANE IN SOLN
RESPIRATORY_TRACT | Status: AC
  Filled 2017-01-13: qty 250

## 2017-01-13 MED ORDER — LIDOCAINE HCL (CARDIAC) 20 MG/ML IV SOLN
INTRAVENOUS | Status: DC | PRN
  Administered 2017-01-13: 100 mg via INTRAVENOUS

## 2017-01-13 MED ORDER — ACETAMINOPHEN 10 MG/ML IV SOLN
INTRAVENOUS | Status: DC | PRN
  Administered 2017-01-13: 1000 mg via INTRAVENOUS

## 2017-01-13 MED ORDER — SODIUM CHLORIDE 0.9 % IV SOLN
INTRAVENOUS | Status: AC
  Filled 2017-01-13: qty 1000

## 2017-01-13 SURGICAL SUPPLY — 31 items
BAG DRAIN CYSTO-URO LG1000N (MISCELLANEOUS) ×3 IMPLANT
BASKET ZERO TIP 1.9FR (BASKET) ×6 IMPLANT
CATH URETL 5X70 OPEN END (CATHETERS) ×3 IMPLANT
CNTNR SPEC 2.5X3XGRAD LEK (MISCELLANEOUS) ×1
CONRAY 43 FOR UROLOGY 50M (MISCELLANEOUS) ×3 IMPLANT
CONT SPEC 4OZ STER OR WHT (MISCELLANEOUS) ×2
CONTAINER SPEC 2.5X3XGRAD LEK (MISCELLANEOUS) ×1 IMPLANT
DRAPE UTILITY 15X26 TOWEL STRL (DRAPES) ×3 IMPLANT
FIBER LASER LITHO 273 (Laser) ×3 IMPLANT
GLOVE BIO SURGEON STRL SZ 6.5 (GLOVE) ×8 IMPLANT
GLOVE BIO SURGEONS STRL SZ 6.5 (GLOVE) ×4
GOWN STRL REUS W/ TWL LRG LVL3 (GOWN DISPOSABLE) ×2 IMPLANT
GOWN STRL REUS W/TWL LRG LVL3 (GOWN DISPOSABLE) ×4
GUIDEWIRE GREEN .038 145CM (MISCELLANEOUS) ×3 IMPLANT
INFUSOR MANOMETER BAG 3000ML (MISCELLANEOUS) ×3 IMPLANT
INTRODUCER DILATOR DOUBLE (INTRODUCER) ×3 IMPLANT
KIT RM TURNOVER CYSTO AR (KITS) ×3 IMPLANT
PACK CYSTO AR (MISCELLANEOUS) ×3 IMPLANT
SCRUB POVIDONE IODINE 4 OZ (MISCELLANEOUS) IMPLANT
SENSORWIRE 0.038 NOT ANGLED (WIRE) ×3
SET CYSTO W/LG BORE CLAMP LF (SET/KITS/TRAYS/PACK) ×3 IMPLANT
SHEATH URETERAL 12FR 45CM (SHEATH) ×3 IMPLANT
SHEATH URETERAL 12FRX35CM (MISCELLANEOUS) IMPLANT
SOL .9 NS 3000ML IRR  AL (IV SOLUTION) ×4
SOL .9 NS 3000ML IRR UROMATIC (IV SOLUTION) ×2 IMPLANT
STENT URET 6FRX24 CONTOUR (STENTS) IMPLANT
STENT URET 6FRX26 CONTOUR (STENTS) ×6 IMPLANT
SURGILUBE 2OZ TUBE FLIPTOP (MISCELLANEOUS) ×3 IMPLANT
SYRINGE IRR TOOMEY STRL 70CC (SYRINGE) ×3 IMPLANT
WATER STERILE IRR 1000ML POUR (IV SOLUTION) ×3 IMPLANT
WIRE SENSOR 0.038 NOT ANGLED (WIRE) ×1 IMPLANT

## 2017-01-13 NOTE — Discharge Instructions (Signed)
AMBULATORY SURGERY  DISCHARGE INSTRUCTIONS   1) The drugs that you were given will stay in your system until tomorrow so for the next 24 hours you should not:  A) Drive an automobile B) Make any legal decisions C) Drink any alcoholic beverage   2) You may resume regular meals tomorrow.  Today it is better to start with liquids and gradually work up to solid foods.  You may eat anything you prefer, but it is better to start with liquids, then soup and crackers, and gradually work up to solid foods.   3) Please notify your doctor immediately if you have any unusual bleeding, trouble breathing, redness and pain at the surgery site, drainage, fever, or pain not relieved by medication.   4) Additional Instructions: Drink plenty of water. Avoid caffeine  Hospital main number [phone]

## 2017-01-13 NOTE — Transfer of Care (Signed)
Immediate Anesthesia Transfer of Care Note  Patient: Edward Petersen  Procedure(s) Performed: Procedure(s): URETEROSCOPY WITH HOLMIUM LASER LITHOTRIPSY (Bilateral) CYSTOSCOPY WITH STENT REPLACEMENT (Bilateral)  Patient Location: PACU  Anesthesia Type:General  Level of Consciousness: responds to stimulation  Airway & Oxygen Therapy: Patient Spontanous Breathing and Patient connected to nasal cannula oxygen  Post-op Assessment: Report given to RN and Post -op Vital signs reviewed and stable  Post vital signs: Reviewed and stable  Last Vitals:  Vitals:   01/13/17 0615 01/13/17 1108  BP: (!) 121/91 117/80  Pulse: 66 73  Resp: 16 18  Temp: 36.7 C 36.4 C    Last Pain:  Vitals:   01/13/17 1108  TempSrc: Tympanic  PainSc: Asleep         Complications: No apparent anesthesia complications

## 2017-01-13 NOTE — Anesthesia Preprocedure Evaluation (Signed)
Anesthesia Evaluation  Patient identified by MRN, date of birth, ID band Patient awake    Reviewed: Allergy & Precautions, NPO status , Patient's Chart, lab work & pertinent test results  History of Anesthesia Complications (+) PONV and history of anesthetic complications  Airway Mallampati: I  TM Distance: >3 FB Neck ROM: Full    Dental no notable dental hx.    Pulmonary neg pulmonary ROS, neg sleep apnea, neg COPD,    breath sounds clear to auscultation- rhonchi (-) wheezing      Cardiovascular Exercise Tolerance: Good (-) hypertension(-) CAD and (-) Past MI  Rhythm:Regular Rate:Normal - Systolic murmurs and - Diastolic murmurs    Neuro/Psych negative neurological ROS  negative psych ROS   GI/Hepatic negative GI ROS, Neg liver ROS,   Endo/Other  neg diabetes  Renal/GU Renal disease: nephrolithiasis.     Musculoskeletal  (+) Arthritis ,   Abdominal (+) - obese,   Peds  Hematology negative hematology ROS (+)   Anesthesia Other Findings Past Medical History: 2000: Bell's palsy     Comment: resolved except tinnitus in right ear. No date: Chickenpox No date: Complication of anesthesia No date: Gout No date: History of kidney stones No date: Osteoarthritis     Comment: both wrists 2000: PONV (postoperative nausea and vomiting)     Comment: shoulder surgery No date: Traumatic injury of head   Reproductive/Obstetrics                             Anesthesia Physical Anesthesia Plan  ASA: II  Anesthesia Plan: General   Post-op Pain Management:    Induction: Intravenous  Airway Management Planned: Oral ETT  Additional Equipment:   Intra-op Plan:   Post-operative Plan: Extubation in OR  Informed Consent: I have reviewed the patients History and Physical, chart, labs and discussed the procedure including the risks, benefits and alternatives for the proposed anesthesia with the  patient or authorized representative who has indicated his/her understanding and acceptance.   Dental advisory given  Plan Discussed with: CRNA and Anesthesiologist  Anesthesia Plan Comments:         Anesthesia Quick Evaluation

## 2017-01-13 NOTE — Anesthesia Procedure Notes (Signed)
Procedure Name: Intubation Date/Time: 01/13/2017 7:55 AM Performed by: Darlyne Russian Pre-anesthesia Checklist: Patient identified, Emergency Drugs available, Suction available, Patient being monitored and Timeout performed Patient Re-evaluated:Patient Re-evaluated prior to inductionOxygen Delivery Method: Circle system utilized Preoxygenation: Pre-oxygenation with 100% oxygen Intubation Type: IV induction Ventilation: Mask ventilation without difficulty Laryngoscope Size: Mac and 4 Grade View: Grade I Tube type: Oral Tube size: 7.5 mm Number of attempts: 1 Airway Equipment and Method: Stylet Placement Confirmation: ETT inserted through vocal cords under direct vision,  positive ETCO2 and breath sounds checked- equal and bilateral Secured at: 22 cm Tube secured with: Tape Dental Injury: Teeth and Oropharynx as per pre-operative assessment

## 2017-01-13 NOTE — Anesthesia Postprocedure Evaluation (Signed)
Anesthesia Post Note  Patient: Edward Petersen  Procedure(s) Performed: Procedure(s) (LRB): URETEROSCOPY WITH HOLMIUM LASER LITHOTRIPSY (Bilateral) CYSTOSCOPY WITH STENT REPLACEMENT (Bilateral)  Patient location during evaluation: PACU Anesthesia Type: General Level of consciousness: awake and alert and oriented Pain management: pain level controlled Vital Signs Assessment: post-procedure vital signs reviewed and stable Respiratory status: spontaneous breathing, nonlabored ventilation and respiratory function stable Cardiovascular status: blood pressure returned to baseline and stable Postop Assessment: no signs of nausea or vomiting Anesthetic complications: no     Last Vitals:  Vitals:   01/13/17 1135 01/13/17 1155  BP: 121/85 124/87  Pulse: 66 69  Resp: 18 18  Temp:      Last Pain:  Vitals:   01/13/17 1155  TempSrc:   PainSc: 0-No pain                 Huyen Perazzo

## 2017-01-13 NOTE — Interval H&P Note (Signed)
History and Physical Interval Note:  01/13/2017 7:27 AM  Edward Petersen  has presented today for surgery, with the diagnosis of BILATERAL URETERAL STONES  The various methods of treatment have been discussed with the patient and family. After consideration of risks, benefits and other options for treatment, the patient has consented to  Procedure(s): URETEROSCOPY WITH HOLMIUM LASER LITHOTRIPSY (Left) CYSTOSCOPY WITH STENT REPLACEMENT (Left) as a surgical intervention .  The patient's history has been reviewed, patient examined, no change in status, stable for surgery.  I have reviewed the patient's chart and labs.  Questions were answered to the patient's satisfaction.    RRR CTAB  Hollice Espy

## 2017-01-13 NOTE — Anesthesia Post-op Follow-up Note (Cosign Needed)
Anesthesia QCDR form completed.        

## 2017-01-13 NOTE — Op Note (Signed)
Date of procedure: 01/13/17  Preoperative diagnosis:  1. Bilateral nephrolithiasis   Postoperative diagnosis:  1. Same as above   Procedure: 1. Bilateral ureteroscopy 2. Laser lithotripsy 3. Bilateral stent exchange 4. Basket extraction of Stone fragment 5. Bilateral retrograde pyelogram  Surgeon: Hollice Espy, MD  Anesthesia: General  Complications: None  Intraoperative findings: Numerous hard stones in nearly every calyx bilaterally.  EBL: Minimal  Specimens: Stone fragment, not sent for stone analysis  Drains: 6 x 26 French double-J ureteral stent bilaterally  Indication: Edward Petersen is a 66 y.o. patient with bilateral nephrolithiasis and previous bilateral ureteral stones status post recent ureteroscopy to treat his obstructing ureteral stones returns today for definitive management of his nonobstructing upper tract stones..  After reviewing the management options for treatment, he elected to proceed with the above surgical procedure(s). We have discussed the potential benefits and risks of the procedure, side effects of the proposed treatment, the likelihood of the patient achieving the goals of the procedure, and any potential problems that might occur during the procedure or recuperation. Informed consent has been obtained.  Description of procedure:  The patient was taken to the operating room and general anesthesia was induced.  The patient was placed in the dorsal lithotomy position, prepped and draped in the usual sterile fashion, and preoperative antibiotics were administered. A preoperative time-out was performed.   A 21 French scope was advanced per urethra into the bladder. Attention was turned to the left ureteral orifice from which a ureteral stent was seen emanating. The distal coil of the stent was grasped and brought to level of the urethral meatus. This is then cannulated using a sensor wire up to level of the kidney. The wire was left in place and the  stent was removed. The contralateral stent was also grasped and the same procedure was undertaken. The right wire was snapped in place as a safety wire. Turning back to the left side, a dual lumen catheter was used to introduce the Super Stiff wire up to level of the kidney. A Cook ureteral access sheath was then advanced up to level of the proximal ureter without difficulty under fluoroscopic guidance. The inner cannula was removed. A 7 French flexible ureteroscope was then brought in and formal pyeloscopy was performed. Each and every calyx was visualized. There was innumerable stones, multiple in each calyx and some partially embedded into the tissue of the papilla. A 270  laser fiber was then brought in and using settings of 1.2 J and 15 Hz, the larger stones were fragmented into 3 or 4 smaller pieces. A basket was then used to extract each and every stone from the left side. This took quite some time and a significant amount of stone burden was removed from this side. Once visually cleared, a retrograde pyelogram was performed by injecting contrast through the working channel of the ureteroscope. This created a roadmap of the kidney. The suture that each never calyx was visualized. There was one stone which remained visible on fluoroscopy which was noted to be within an extremely stenotic infundibulum and unable to be accessed. This was at the level of the mid pole. Finally, the scope was backed down the length of the ureter removing the ureteral access sheath and inspecting the ureter along the way. There were no ureteral injuries noted and no residual stone fragments. The safety wire was then backloaded over a rigid cystoscope and a 6 x 26 French double-J ureteral stone was advanced over the wire  up to level of the kidney. The wire was partially drawn until full coil was noted within the renal pelvis. The wire was then fully withdrawn and a full coil was noted within the bladder.  Attention was then turned  to the right side. The same exact procedure was performed on the side as described above. There was innumerable stones, some even larger than the right side which were fragmented using the laser and basket extraction was performed. This was time consuming and somewhat tedious. Eventually, I was able to clear it the majority of stone from this right side leaving only small fragments behind which are 1-2 mm size. Fluoroscopically, there was no obvious residual stone fragment was completed. Finally, retropyelogram was performed on this side which showed a decompressed collecting system without any significant filling defects. The scope was then backed down the length of the ureter removing the access sheath and inspecting along the way. Again, there are no ureteral injuries or significant stone burden residual. The safety wire again was backloaded over a rigid cystoscope and a 6 x 26 French double-J ureteral stent was placed using the technique described above. The bladder was then drained. The scope was removed. The patient was cleaned and dried, repositioned supine position reversed anesthesia, taken the PACU in stable condition.  Plan: Patient will return in 2  weeks for cystoscopy, stent removal.  Hollice Espy, M.D.

## 2017-01-13 NOTE — H&P (View-Only) (Signed)
01/06/2017 4:39 PM   Edward Petersen 66/30/1952 [id number]  Referring provider: Derinda Late, MD [phone] [facility] [place], [place] [address]  Chief Complaint  Patient presents with  . Cysto Stent Removal    HPI: 66 year old male who presents today for follow-up after ureteroscopy on 12/29/16 for treatment of his bilateral ureteral stones.  1 - Bilateral Ureteral Stones - Rt mid 44mm stone with intrarenal stones 68mm x 4 and Lt distal 59mm stone with intrarenal stones 82mm x 5 by ER CT 12/2016 on eval flank pain. S/p bilateral ureteral stent placement on 12/09/16 by Dr. Phebe Colla.  All stones approx 750HU. One prior stone managed with SWL many years ago.  S/p B URS on 12/29/16.  Tolerating stents well, some hematuria with activity and flank pain with voiding.  No fevers of chills.    2 - Acute Renal Failure - Cr 1.88 by ER labs on admission. Unknown baseline, but denies prior renal insuficiency.  Improved to 1.10 by discharge on POD1.     3 - Enlarging Left Renal Cyst - 8cm left lower pole cyst with some early mass effect by CT 12/2016, was 4cm 2006. No prior contrast imaging to r/o enhancement.    PMH: Past Medical History:  Diagnosis Date  . Bell's palsy 2000   resolved except tinnitus in right ear.  . Chickenpox   . Complication of anesthesia   . Gout   . History of kidney stones   . Osteoarthritis    both wrists  . PONV (postoperative nausea and vomiting) 2000   shoulder surgery  . Traumatic injury of head     Surgical History: Past Surgical History:  Procedure Laterality Date  . COLONOSCOPY    . COLONOSCOPY WITH PROPOFOL N/A 11/30/2015   Procedure: COLONOSCOPY WITH PROPOFOL;  Surgeon: Hulen Luster, MD;  Location: [facility] ENDOSCOPY;  Service: Gastroenterology;  Laterality: N/A;  . CYSTOSCOPY W/ RETROGRADES Bilateral 12/09/2016   Procedure: CYSTOSCOPY WITH RETROGRADE PYELOGRAM;  Surgeon: Alexis Frock, MD;  Location: [facility] ORS;   Service: Urology;  Laterality: Bilateral;  . CYSTOSCOPY W/ URETERAL STENT PLACEMENT Bilateral 12/29/2016   Procedure: CYSTOSCOPY WITH STENT REPLACEMENT;  Surgeon: Hollice Espy, MD;  Location: [facility] ORS;  Service: Urology;  Laterality: Bilateral;  . CYSTOSCOPY WITH STENT PLACEMENT Bilateral 12/09/2016   Procedure: CYSTOSCOPY WITH STENT PLACEMENT;  Surgeon: Alexis Frock, MD;  Location: [facility] ORS;  Service: Urology;  Laterality: Bilateral;  . left shoulder rotator cuff repair    . LITHOTRIPSY    . topaz of right elbow    . URETEROSCOPY WITH HOLMIUM LASER LITHOTRIPSY Bilateral 12/29/2016   Procedure: URETEROSCOPY WITH HOLMIUM LASER LITHOTRIPSY;  Surgeon: Hollice Espy, MD;  Location: [facility] ORS;  Service: Urology;  Laterality: Bilateral;    Home Medications:  Allergies as of 01/06/2017      Reactions   Primidone Nausea Only, Other (See Comments)   Suicidal thoughts      Medication List       Accurate as of 01/06/17  4:39 PM. Always use your most recent med list.          acetaminophen 500 MG tablet Commonly known as:  TYLENOL Take 500 mg by mouth every 6 (six) hours as needed.   [employer] EAR DROPS OT Place 4 drops into the right eye 4 (four) times daily. As needed   docusate sodium 100 MG capsule Commonly known as:  COLACE Take 1 capsule (100 mg total) by  mouth 2 (two) times daily. While on pain medications   ibuprofen 200 MG tablet Commonly known as:  ADVIL,MOTRIN Take 200 mg by mouth every 6 (six) hours as needed.   multivitamin with minerals Tabs tablet Take 1 tablet by mouth daily.   omega-3 acid ethyl esters 1 g capsule Commonly known as:  LOVAZA Take 1 g by mouth daily.   oxybutynin 10 MG 24 hr tablet Commonly known as:  DITROPAN XL Take 1 tablet (10 mg total) by mouth at bedtime. X 2 weeks   tamsulosin 0.4 MG Caps capsule Commonly known as:  FLOMAX Take 1 capsule (0.4 mg total) by mouth daily.   VITAMIN D PO Take 1 tablet by mouth daily.       Allergies:    Allergies  Allergen Reactions  . Primidone Nausea Only and Other (See Comments)    Suicidal thoughts    Family History: Family History  Problem Relation Petersen of Onset  . Mesothelioma Father   . Pancreatic cancer Brother     Social History:  reports that he has never smoked. He has never used smokeless tobacco. He reports that he does not drink alcohol or use drugs.  ROS: 12 point ROS negative other than as per HPI  Physical Exam: BP 133/80   Pulse 75   Ht 5\' 9"  (1.753 m)   Wt 195 lb (88.5 kg)   BMI 28.80 kg/m   Constitutional:  Alert and oriented, No acute distress. HEENT: [place] AT, moist mucus membranes.  Trachea midline, no masses. Cardiovascular: No clubbing, cyanosis, or edema. Respiratory: Normal respiratory effort, no increased work of breathing.   GI: Abdomen is soft, nontender, nondistended, no abdominal masses GU: No CVA tenderness.  Skin: No rashes, bruises or suspicious lesions. Neurologic: Grossly intact, no focal deficits, moving all 4 extremities. Psychiatric: Normal mood and affect.  Laboratory Data: Lab Results  Component Value Date   WBC 9.5 12/10/2016   HGB 13.6 12/10/2016   HCT 38.2 (L) 12/10/2016   MCV 90.1 12/10/2016   PLT 241 12/10/2016    Lab Results  Component Value Date   CREATININE 1.10 12/10/2016    Urinalysis    Component Value Date/Time   COLORURINE YELLOW (A) 12/09/2016 0927   APPEARANCEUR Hazy (A) 12/17/2016 1402   LABSPEC 1.016 12/09/2016 0927   PHURINE 5.0 12/09/2016 0927   GLUCOSEU Negative 12/17/2016 1402   HGBUR LARGE (A) 12/09/2016 0927   BILIRUBINUR Negative 12/17/2016 1402   KETONESUR 5 (A) 12/09/2016 0927   PROTEINUR Trace (A) 12/17/2016 1402   PROTEINUR 30 (A) 12/09/2016 0927   NITRITE Negative 12/17/2016 1402   NITRITE NEGATIVE 12/09/2016 0927   LEUKOCYTESUR Trace (A) 12/17/2016 1402    Pertinent Imaging: Study Result   CLINICAL DATA:  Left flank pain for 5 days  EXAM: CT ABDOMEN AND PELVIS WITHOUT  CONTRAST  TECHNIQUE: Multidetector CT imaging of the abdomen and pelvis was performed following the standard protocol without oral or intravenous contrast material administration.  COMPARISON:  June 21, 2011  FINDINGS: Lower chest: Lung bases are clear.  There is a small hiatal hernia.  Hepatobiliary: There is a focal area of decreased attenuation in the anterior segment of the right lobe of the liver measuring 1.6 x 1.1 cm, stable from prior study and likely benign hemangioma. There is a 7 mm probable cyst near the fissure for the ligamentum teres in the right lobe of the liver anteriorly, stable. No new liver lesions are evident on this noncontrast enhanced study.  There is apparent sludge in the gallbladder with possible noncalcified gallstones. There is no gallbladder wall thickening. There is no biliary duct dilatation.  Pancreas: There is no pancreatic mass or inflammatory focus.  Spleen: No splenic lesions are evident.  Adrenals/Urinary Tract: The right adrenal is unremarkable. There is an 8 x 8 mm apparent adenoma in the left adrenal. There is a cyst arising from the lower pole of the left kidney measuring 8.0 x 6.2 x 6.3 cm. There is moderate hydronephrosis on the left. There is no hydronephrosis on the right. On the right, there are multiple calculi throughout the kidney. The largest calculus in the right kidney is in the upper pole region measuring 1.3 x 0.8 cm. Other calculi range in size from as small as 3 mm to as large as 9 mm. Multiple calculi are noted in the left kidney ranging in size from as small as 1 mm to as large as 8 mm. There is subtle edema involving the left kidney. On the left, there is a calculus at the level of the mid sacrum in the left ureter measuring 1.1 x 0.6 cm. On the right, there is a calculus at the level of L4-5 measuring 7 x 7 mm which is not causing appreciable hydronephrosis. Note, however, that there is mild mesenteric  thickening and fluid near this ureteral calculi on the right as well as fluid tracking into the right lateral mid pelvis. No other ureteral calculi identified. Urinary bladder is midline with wall thickness within normal limits.  Stomach/Bowel: There is no bowel wall or mesenteric thickening. No bowel obstruction. No free air or portal venous air.  Vascular/Lymphatic: There is no appreciable abdominal aortic aneurysm ; the aorta is mildly tortuous in the abdominal region. No appreciable vascular calcification is evident. There is no appreciable adenopathy in the abdomen or pelvis.  Reproductive: Prostate appears rather prominent. Contains multiple calculi. The prostate impresses on the inferior aspect of the urinary bladder. Seminal vesicles appear normal. Apart from the prostate, there is no pelvic mass.  Other: There is no periappendiceal region inflammation. No abscess or ascites evident. There is fat in each inguinal ring.  Musculoskeletal: There is a hemangioma in the L3 vertebral body, also present on prior study. There is focal disc narrowing at L3-4. There is a stable sclerotic focus in the left iliac crest, a possible bone island. No new sclerotic foci identified compared to prior study. No lytic or destructive bone lesions evident. There is no abdominal wall or intramuscular lesion.  IMPRESSION: There is a ureteral calculus at the mid sacral level on the left measuring 1.1 x 0.6 cm which is causing moderate hydronephrosis and perinephric edema peer  On the right there is a calculus at the L4-5 level measuring 7 x 7 mm with localized. Localized periureteral inflammation but no appreciable hydronephrosis on the right.  There are multiple intrarenal calculi, slightly more on the right than on the left.  Prostate is enlarged and abuts the inferior urinary bladder. There are multiple prostatic calculi. Advise correlation with PSA.  Small left adrenal adenoma,  a benign finding.  Sludge and possible nonshadowing calculi in gallbladder. Gallbladder wall not thickened.  Small hiatal hernia.  No bowel obstruction. No abscess. No periappendiceal region inflammatory change.  Hemangioma L3 vertebral body.  Bone island left iliac crest, stable.   Electronically Signed   By: Lowella Grip III M.D.   On: 12/09/2016 09:54   CT scan reviewed with the patient and personally today.  Assessment & Plan:    1. Bilateral ureteral stones Status post bilateral ureteroscopy for obstructing ureteral stones, well tolerated. Stents remain in place.  We discussed options today in detail for further management of his upper tract stones. Various options including bilateral ureteral stent removal today at bedside in expected management/observation of his stones versus staged bilateral ureteroscopy for treatment of his upper tract stones. Risk/ benefits of each were discussed.  After lengthy discussion today, he is most interested in returning back to the operating room. We discussed that we will start on his left side and attempt to clear him of the stone burden. I do feel that there is too much stone burden to attempt a bilateral ureteroscopy in the setting. He verbalized understanding of this. We did review his previous CT scan again today in detail.  Risks and benefits of ureteroscopy were reviewed including but not limited to infection, bleeding, pain, ureteral injury which could require open surgery versus prolonged indwelling if ureteralperforation occurs, persistent stone disease, requirement for staged procedure, possible stent, and global anesthesia risks. Patient expressed understanding and desires to proceed with ureteroscopy.  Preop urine culture  He will need  further stone surgery to treat his nonobstructing stone burden in the future along with stone analysis and metatbolic work up.  - CULTURE, URINE COMPREHENSIVE - Urinalysis,  Complete  2. Acute renal failure, unspecified acute renal failure type ([place]) 2/2 bilateral obstruction s/p stent  3. Renal cyst, left Enlarging, asymptomatic. Will assess further with post ureteroscopic renal ultrasound.  Schedule left ureteroscopy, LL, stent exchange  Hollice Espy, MD  [facility] [address] [place], [place] [address] [phone]

## 2017-01-27 ENCOUNTER — Ambulatory Visit (INDEPENDENT_AMBULATORY_CARE_PROVIDER_SITE_OTHER): Payer: Managed Care, Other (non HMO) | Admitting: Urology

## 2017-01-27 VITALS — BP 131/78 | HR 73 | Ht 69.0 in | Wt 194.6 lb

## 2017-01-27 DIAGNOSIS — N200 Calculus of kidney: Secondary | ICD-10-CM

## 2017-01-27 DIAGNOSIS — N201 Calculus of ureter: Secondary | ICD-10-CM

## 2017-01-27 LAB — URINALYSIS, COMPLETE
Bilirubin, UA: NEGATIVE
Glucose, UA: NEGATIVE
KETONES UA: NEGATIVE
Nitrite, UA: NEGATIVE
SPEC GRAV UA: 1.025 (ref 1.005–1.030)
Urobilinogen, Ur: 0.2 mg/dL (ref 0.2–1.0)
pH, UA: 5.5 (ref 5.0–7.5)

## 2017-01-27 LAB — MICROSCOPIC EXAMINATION: RBC, UA: >30 /hpf — AB (ref 0–?)

## 2017-01-27 MED ORDER — LIDOCAINE HCL 2 % EX GEL
1.0000 "application " | Freq: Once | CUTANEOUS | Status: AC
  Administered 2017-01-27: 1 via URETHRAL

## 2017-01-27 MED ORDER — CIPROFLOXACIN HCL 500 MG PO TABS
500.0000 mg | ORAL_TABLET | Freq: Once | ORAL | Status: AC
  Administered 2017-01-27: 500 mg via ORAL

## 2017-01-27 NOTE — Progress Notes (Signed)
   01/27/17  CC:  Chief Complaint  Patient presents with  . Cysto    stent    HPI: 66 yo M with bilateral ureteral stones and upper tract stones s/p staged ureteroscopy.  He returns today for cytoscopy, stent removal.    Denies fevers, chills, dysuria or gross hematuria.  Mild frequency related to stent.    UA reviewed.    Blood pressure 131/78, pulse 73, height 5\' 9"  (1.753 m), weight 194 lb 9.6 oz (88.3 kg). NED. A&Ox3.   No respiratory distress   Abd soft, NT, ND Normal phallus with bilateral descended testicles  Cystoscopy/ Stent removal procedure  Patient identification was confirmed, informed consent was obtained, and patient was prepped using Betadine solution.  Lidocaine jelly was administered per urethral meatus.    Preoperative abx where received prior to procedure.    Procedure: - Flexible cystoscope introduced, without any difficulty.   - Thorough search of the bladder revealed:    normal urethral meatus  Stent seen emanating from bilateral ureteral orifice, grasped with stent graspers, and removed in entirety.    Post-Procedure: - Patient tolerated the procedure well   Assessment/ Plan:  1. Bilateral nephrolithiasis S/p bilateral ureteroscopy Stents removed today F/u in 4 weeks with RUS/ review stone analysis Will order [id number] urine metabolic work up at that visit Warning symptoms reviewed  2. Ureteral stone As above - Urinalysis, Complete - ciprofloxacin (CIPRO) tablet 500 mg; Take 1 tablet (500 mg total) by mouth once.  Return in about 4 weeks (around 02/24/2017) for f/u RUS.  Hollice Espy, MD

## 2017-01-27 NOTE — Addendum Note (Signed)
Addended by: Wilson Singer on: 01/27/2017 04:31 PM   Modules accepted: Orders

## 2017-02-27 ENCOUNTER — Telehealth: Payer: Self-pay | Admitting: Urology

## 2017-02-27 DIAGNOSIS — N200 Calculus of kidney: Secondary | ICD-10-CM

## 2017-02-27 NOTE — Telephone Encounter (Signed)
Patient called today because he never got his RUS when I checked his chart there was never an order placed for this. Can you order this and I will take care of getting this done for him?  Thanks,  Sharyn Lull

## 2017-03-04 ENCOUNTER — Ambulatory Visit: Payer: Managed Care, Other (non HMO) | Admitting: Urology

## 2017-03-25 ENCOUNTER — Ambulatory Visit: Payer: Managed Care, Other (non HMO) | Admitting: Urology

## 2017-03-31 ENCOUNTER — Ambulatory Visit: Payer: Managed Care, Other (non HMO)

## 2017-04-07 ENCOUNTER — Ambulatory Visit: Payer: Managed Care, Other (non HMO) | Admitting: Urology

## 2017-05-26 DIAGNOSIS — M9903 Segmental and somatic dysfunction of lumbar region: Secondary | ICD-10-CM | POA: Diagnosis not present

## 2017-05-26 DIAGNOSIS — M5136 Other intervertebral disc degeneration, lumbar region: Secondary | ICD-10-CM | POA: Diagnosis not present

## 2017-05-26 DIAGNOSIS — M546 Pain in thoracic spine: Secondary | ICD-10-CM | POA: Diagnosis not present

## 2017-05-26 DIAGNOSIS — M9902 Segmental and somatic dysfunction of thoracic region: Secondary | ICD-10-CM | POA: Diagnosis not present

## 2017-05-26 DIAGNOSIS — M545 Low back pain: Secondary | ICD-10-CM | POA: Diagnosis not present

## 2017-05-26 DIAGNOSIS — M9901 Segmental and somatic dysfunction of cervical region: Secondary | ICD-10-CM | POA: Diagnosis not present

## 2017-05-27 DIAGNOSIS — M9902 Segmental and somatic dysfunction of thoracic region: Secondary | ICD-10-CM | POA: Diagnosis not present

## 2017-05-27 DIAGNOSIS — M545 Low back pain: Secondary | ICD-10-CM | POA: Diagnosis not present

## 2017-05-27 DIAGNOSIS — M5136 Other intervertebral disc degeneration, lumbar region: Secondary | ICD-10-CM | POA: Diagnosis not present

## 2017-05-27 DIAGNOSIS — M9903 Segmental and somatic dysfunction of lumbar region: Secondary | ICD-10-CM | POA: Diagnosis not present

## 2017-05-28 DIAGNOSIS — M9902 Segmental and somatic dysfunction of thoracic region: Secondary | ICD-10-CM | POA: Diagnosis not present

## 2017-05-28 DIAGNOSIS — M9903 Segmental and somatic dysfunction of lumbar region: Secondary | ICD-10-CM | POA: Diagnosis not present

## 2017-05-28 DIAGNOSIS — M5136 Other intervertebral disc degeneration, lumbar region: Secondary | ICD-10-CM | POA: Diagnosis not present

## 2017-05-28 DIAGNOSIS — M545 Low back pain: Secondary | ICD-10-CM | POA: Diagnosis not present

## 2017-06-01 DIAGNOSIS — M9903 Segmental and somatic dysfunction of lumbar region: Secondary | ICD-10-CM | POA: Diagnosis not present

## 2017-06-01 DIAGNOSIS — M545 Low back pain: Secondary | ICD-10-CM | POA: Diagnosis not present

## 2017-06-01 DIAGNOSIS — M9902 Segmental and somatic dysfunction of thoracic region: Secondary | ICD-10-CM | POA: Diagnosis not present

## 2017-06-01 DIAGNOSIS — M5136 Other intervertebral disc degeneration, lumbar region: Secondary | ICD-10-CM | POA: Diagnosis not present

## 2017-06-03 DIAGNOSIS — M5136 Other intervertebral disc degeneration, lumbar region: Secondary | ICD-10-CM | POA: Diagnosis not present

## 2017-06-03 DIAGNOSIS — M545 Low back pain: Secondary | ICD-10-CM | POA: Diagnosis not present

## 2017-06-03 DIAGNOSIS — M9903 Segmental and somatic dysfunction of lumbar region: Secondary | ICD-10-CM | POA: Diagnosis not present

## 2017-06-03 DIAGNOSIS — M9902 Segmental and somatic dysfunction of thoracic region: Secondary | ICD-10-CM | POA: Diagnosis not present

## 2017-06-05 DIAGNOSIS — M5136 Other intervertebral disc degeneration, lumbar region: Secondary | ICD-10-CM | POA: Diagnosis not present

## 2017-06-05 DIAGNOSIS — M9902 Segmental and somatic dysfunction of thoracic region: Secondary | ICD-10-CM | POA: Diagnosis not present

## 2017-06-05 DIAGNOSIS — M545 Low back pain: Secondary | ICD-10-CM | POA: Diagnosis not present

## 2017-06-05 DIAGNOSIS — M9903 Segmental and somatic dysfunction of lumbar region: Secondary | ICD-10-CM | POA: Diagnosis not present

## 2017-06-08 DIAGNOSIS — M9901 Segmental and somatic dysfunction of cervical region: Secondary | ICD-10-CM | POA: Diagnosis not present

## 2017-06-08 DIAGNOSIS — M546 Pain in thoracic spine: Secondary | ICD-10-CM | POA: Diagnosis not present

## 2017-06-08 DIAGNOSIS — M545 Low back pain: Secondary | ICD-10-CM | POA: Diagnosis not present

## 2017-06-08 DIAGNOSIS — M9902 Segmental and somatic dysfunction of thoracic region: Secondary | ICD-10-CM | POA: Diagnosis not present

## 2017-06-08 DIAGNOSIS — M5136 Other intervertebral disc degeneration, lumbar region: Secondary | ICD-10-CM | POA: Diagnosis not present

## 2017-06-08 DIAGNOSIS — M9903 Segmental and somatic dysfunction of lumbar region: Secondary | ICD-10-CM | POA: Diagnosis not present

## 2017-06-12 DIAGNOSIS — M9902 Segmental and somatic dysfunction of thoracic region: Secondary | ICD-10-CM | POA: Diagnosis not present

## 2017-06-12 DIAGNOSIS — M545 Low back pain: Secondary | ICD-10-CM | POA: Diagnosis not present

## 2017-06-12 DIAGNOSIS — M546 Pain in thoracic spine: Secondary | ICD-10-CM | POA: Diagnosis not present

## 2017-06-12 DIAGNOSIS — M9901 Segmental and somatic dysfunction of cervical region: Secondary | ICD-10-CM | POA: Diagnosis not present

## 2017-06-12 DIAGNOSIS — M9903 Segmental and somatic dysfunction of lumbar region: Secondary | ICD-10-CM | POA: Diagnosis not present

## 2017-06-12 DIAGNOSIS — M5136 Other intervertebral disc degeneration, lumbar region: Secondary | ICD-10-CM | POA: Diagnosis not present

## 2017-06-15 DIAGNOSIS — M5136 Other intervertebral disc degeneration, lumbar region: Secondary | ICD-10-CM | POA: Diagnosis not present

## 2017-06-15 DIAGNOSIS — M9903 Segmental and somatic dysfunction of lumbar region: Secondary | ICD-10-CM | POA: Diagnosis not present

## 2017-06-15 DIAGNOSIS — M545 Low back pain: Secondary | ICD-10-CM | POA: Diagnosis not present

## 2017-06-15 DIAGNOSIS — M9902 Segmental and somatic dysfunction of thoracic region: Secondary | ICD-10-CM | POA: Diagnosis not present

## 2017-06-17 DIAGNOSIS — M5136 Other intervertebral disc degeneration, lumbar region: Secondary | ICD-10-CM | POA: Diagnosis not present

## 2017-06-17 DIAGNOSIS — M9903 Segmental and somatic dysfunction of lumbar region: Secondary | ICD-10-CM | POA: Diagnosis not present

## 2017-06-17 DIAGNOSIS — M9901 Segmental and somatic dysfunction of cervical region: Secondary | ICD-10-CM | POA: Diagnosis not present

## 2017-06-17 DIAGNOSIS — M546 Pain in thoracic spine: Secondary | ICD-10-CM | POA: Diagnosis not present

## 2017-06-17 DIAGNOSIS — M545 Low back pain: Secondary | ICD-10-CM | POA: Diagnosis not present

## 2017-06-17 DIAGNOSIS — M9902 Segmental and somatic dysfunction of thoracic region: Secondary | ICD-10-CM | POA: Diagnosis not present

## 2017-06-19 DIAGNOSIS — M545 Low back pain: Secondary | ICD-10-CM | POA: Diagnosis not present

## 2017-06-19 DIAGNOSIS — M9902 Segmental and somatic dysfunction of thoracic region: Secondary | ICD-10-CM | POA: Diagnosis not present

## 2017-06-19 DIAGNOSIS — M5136 Other intervertebral disc degeneration, lumbar region: Secondary | ICD-10-CM | POA: Diagnosis not present

## 2017-06-19 DIAGNOSIS — M9901 Segmental and somatic dysfunction of cervical region: Secondary | ICD-10-CM | POA: Diagnosis not present

## 2017-06-19 DIAGNOSIS — M546 Pain in thoracic spine: Secondary | ICD-10-CM | POA: Diagnosis not present

## 2017-06-19 DIAGNOSIS — M9903 Segmental and somatic dysfunction of lumbar region: Secondary | ICD-10-CM | POA: Diagnosis not present

## 2017-06-22 DIAGNOSIS — M9901 Segmental and somatic dysfunction of cervical region: Secondary | ICD-10-CM | POA: Diagnosis not present

## 2017-06-22 DIAGNOSIS — M5136 Other intervertebral disc degeneration, lumbar region: Secondary | ICD-10-CM | POA: Diagnosis not present

## 2017-06-22 DIAGNOSIS — M9903 Segmental and somatic dysfunction of lumbar region: Secondary | ICD-10-CM | POA: Diagnosis not present

## 2017-06-22 DIAGNOSIS — M545 Low back pain: Secondary | ICD-10-CM | POA: Diagnosis not present

## 2017-06-22 DIAGNOSIS — M9902 Segmental and somatic dysfunction of thoracic region: Secondary | ICD-10-CM | POA: Diagnosis not present

## 2017-06-22 DIAGNOSIS — M546 Pain in thoracic spine: Secondary | ICD-10-CM | POA: Diagnosis not present

## 2017-06-24 DIAGNOSIS — M9902 Segmental and somatic dysfunction of thoracic region: Secondary | ICD-10-CM | POA: Diagnosis not present

## 2017-06-24 DIAGNOSIS — M546 Pain in thoracic spine: Secondary | ICD-10-CM | POA: Diagnosis not present

## 2017-06-24 DIAGNOSIS — M9901 Segmental and somatic dysfunction of cervical region: Secondary | ICD-10-CM | POA: Diagnosis not present

## 2017-06-24 DIAGNOSIS — M9903 Segmental and somatic dysfunction of lumbar region: Secondary | ICD-10-CM | POA: Diagnosis not present

## 2017-06-24 DIAGNOSIS — M545 Low back pain: Secondary | ICD-10-CM | POA: Diagnosis not present

## 2017-06-24 DIAGNOSIS — M5136 Other intervertebral disc degeneration, lumbar region: Secondary | ICD-10-CM | POA: Diagnosis not present

## 2017-06-29 DIAGNOSIS — M9902 Segmental and somatic dysfunction of thoracic region: Secondary | ICD-10-CM | POA: Diagnosis not present

## 2017-06-29 DIAGNOSIS — M545 Low back pain: Secondary | ICD-10-CM | POA: Diagnosis not present

## 2017-06-29 DIAGNOSIS — M9901 Segmental and somatic dysfunction of cervical region: Secondary | ICD-10-CM | POA: Diagnosis not present

## 2017-06-29 DIAGNOSIS — M546 Pain in thoracic spine: Secondary | ICD-10-CM | POA: Diagnosis not present

## 2017-06-29 DIAGNOSIS — M9903 Segmental and somatic dysfunction of lumbar region: Secondary | ICD-10-CM | POA: Diagnosis not present

## 2017-06-29 DIAGNOSIS — M5136 Other intervertebral disc degeneration, lumbar region: Secondary | ICD-10-CM | POA: Diagnosis not present

## 2017-07-01 DIAGNOSIS — M545 Low back pain: Secondary | ICD-10-CM | POA: Diagnosis not present

## 2017-07-01 DIAGNOSIS — M9902 Segmental and somatic dysfunction of thoracic region: Secondary | ICD-10-CM | POA: Diagnosis not present

## 2017-07-01 DIAGNOSIS — M5136 Other intervertebral disc degeneration, lumbar region: Secondary | ICD-10-CM | POA: Diagnosis not present

## 2017-07-01 DIAGNOSIS — M25532 Pain in left wrist: Secondary | ICD-10-CM | POA: Diagnosis not present

## 2017-07-01 DIAGNOSIS — M546 Pain in thoracic spine: Secondary | ICD-10-CM | POA: Diagnosis not present

## 2017-07-01 DIAGNOSIS — M9903 Segmental and somatic dysfunction of lumbar region: Secondary | ICD-10-CM | POA: Diagnosis not present

## 2017-07-01 DIAGNOSIS — M9901 Segmental and somatic dysfunction of cervical region: Secondary | ICD-10-CM | POA: Diagnosis not present

## 2017-07-01 DIAGNOSIS — M1812 Unilateral primary osteoarthritis of first carpometacarpal joint, left hand: Secondary | ICD-10-CM | POA: Diagnosis not present

## 2017-07-06 DIAGNOSIS — M546 Pain in thoracic spine: Secondary | ICD-10-CM | POA: Diagnosis not present

## 2017-07-06 DIAGNOSIS — M9901 Segmental and somatic dysfunction of cervical region: Secondary | ICD-10-CM | POA: Diagnosis not present

## 2017-07-06 DIAGNOSIS — M545 Low back pain: Secondary | ICD-10-CM | POA: Diagnosis not present

## 2017-07-06 DIAGNOSIS — M9903 Segmental and somatic dysfunction of lumbar region: Secondary | ICD-10-CM | POA: Diagnosis not present

## 2017-07-06 DIAGNOSIS — M9902 Segmental and somatic dysfunction of thoracic region: Secondary | ICD-10-CM | POA: Diagnosis not present

## 2017-07-06 DIAGNOSIS — M5136 Other intervertebral disc degeneration, lumbar region: Secondary | ICD-10-CM | POA: Diagnosis not present

## 2017-07-08 DIAGNOSIS — M9902 Segmental and somatic dysfunction of thoracic region: Secondary | ICD-10-CM | POA: Diagnosis not present

## 2017-07-08 DIAGNOSIS — M9901 Segmental and somatic dysfunction of cervical region: Secondary | ICD-10-CM | POA: Diagnosis not present

## 2017-07-08 DIAGNOSIS — M5136 Other intervertebral disc degeneration, lumbar region: Secondary | ICD-10-CM | POA: Diagnosis not present

## 2017-07-08 DIAGNOSIS — M545 Low back pain: Secondary | ICD-10-CM | POA: Diagnosis not present

## 2017-07-08 DIAGNOSIS — M546 Pain in thoracic spine: Secondary | ICD-10-CM | POA: Diagnosis not present

## 2017-07-08 DIAGNOSIS — M9903 Segmental and somatic dysfunction of lumbar region: Secondary | ICD-10-CM | POA: Diagnosis not present

## 2017-07-14 DIAGNOSIS — H93299 Other abnormal auditory perceptions, unspecified ear: Secondary | ICD-10-CM | POA: Diagnosis not present

## 2017-07-14 DIAGNOSIS — M26629 Arthralgia of temporomandibular joint, unspecified side: Secondary | ICD-10-CM | POA: Diagnosis not present

## 2017-07-14 DIAGNOSIS — H903 Sensorineural hearing loss, bilateral: Secondary | ICD-10-CM | POA: Diagnosis not present

## 2017-08-26 DIAGNOSIS — M7752 Other enthesopathy of left foot: Secondary | ICD-10-CM | POA: Diagnosis not present

## 2017-08-26 DIAGNOSIS — M79672 Pain in left foot: Secondary | ICD-10-CM | POA: Diagnosis not present

## 2017-08-26 DIAGNOSIS — M10072 Idiopathic gout, left ankle and foot: Secondary | ICD-10-CM | POA: Diagnosis not present

## 2017-09-08 DIAGNOSIS — M79641 Pain in right hand: Secondary | ICD-10-CM | POA: Diagnosis not present

## 2017-09-08 DIAGNOSIS — S62324A Displaced fracture of shaft of fourth metacarpal bone, right hand, initial encounter for closed fracture: Secondary | ICD-10-CM | POA: Diagnosis not present

## 2017-09-17 DIAGNOSIS — S62324D Displaced fracture of shaft of fourth metacarpal bone, right hand, subsequent encounter for fracture with routine healing: Secondary | ICD-10-CM | POA: Diagnosis not present

## 2017-10-01 DIAGNOSIS — S62624D Displaced fracture of medial phalanx of right ring finger, subsequent encounter for fracture with routine healing: Secondary | ICD-10-CM | POA: Diagnosis not present

## 2018-01-07 IMAGING — CT CT RENAL STONE PROTOCOL
2 of 4 series · 14 of 46 positions shown, 16 images · non-contrast
Comparison: June 21, 2011

CLINICAL DATA: Left flank pain for 5 days

EXAM:
CT ABDOMEN AND PELVIS WITHOUT CONTRAST
TECHNIQUE: Multidetector CT imaging of the abdomen and pelvis was performed
following the standard protocol without oral or intravenous contrast
material administration.

[Series 2: stone full standard · axial · 0.82mm/px · z∈[-510,-110]mm · 11 of 97 slices shown, 13 images]
[im 9/97  soft-tissue]
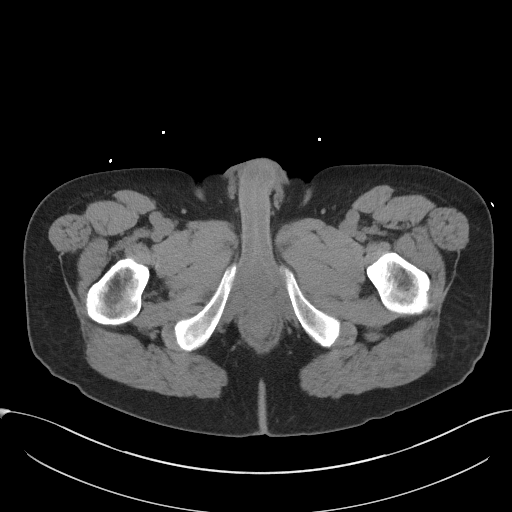
[im 9/97  bone]
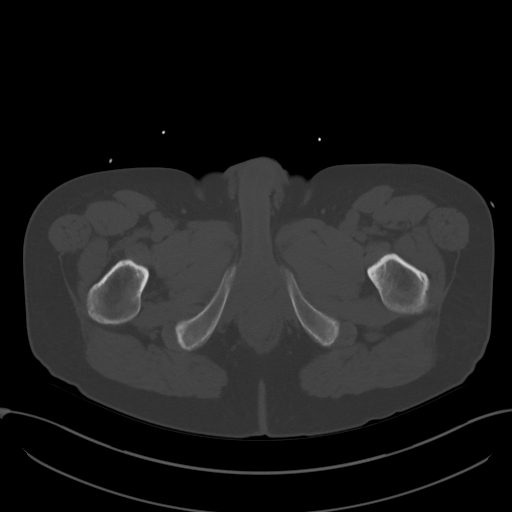
[im 17/97  soft-tissue]
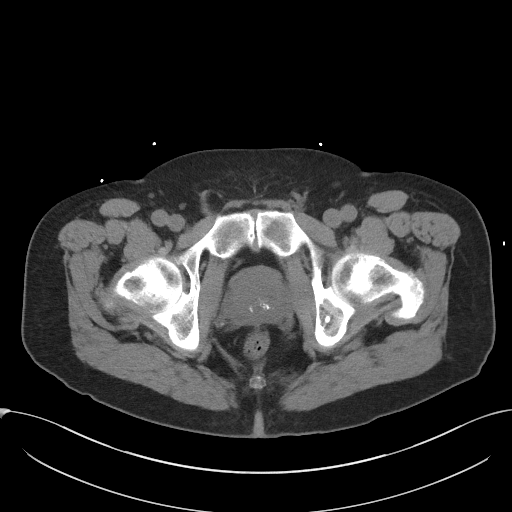
[im 25/97  soft-tissue]
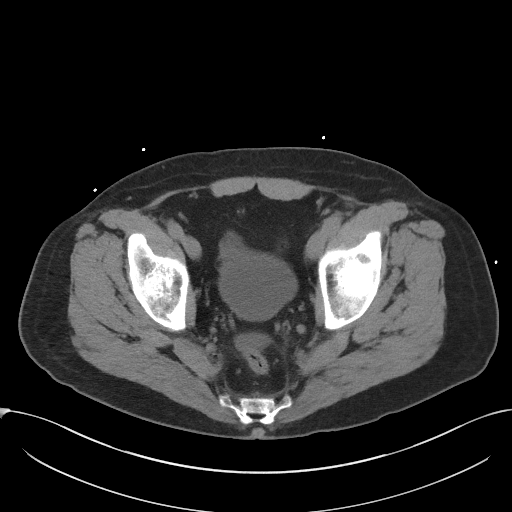
[im 33/97  soft-tissue]
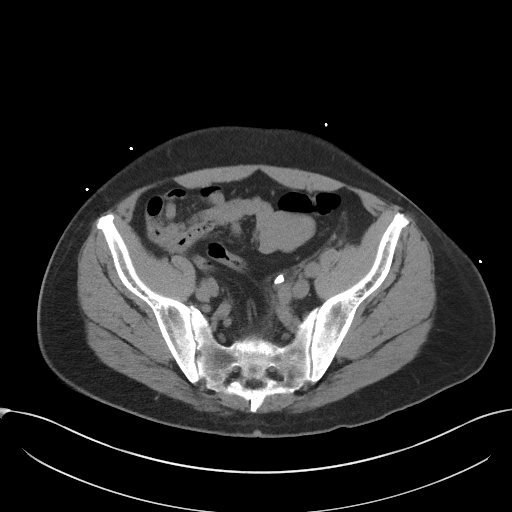
[im 41/97  soft-tissue]
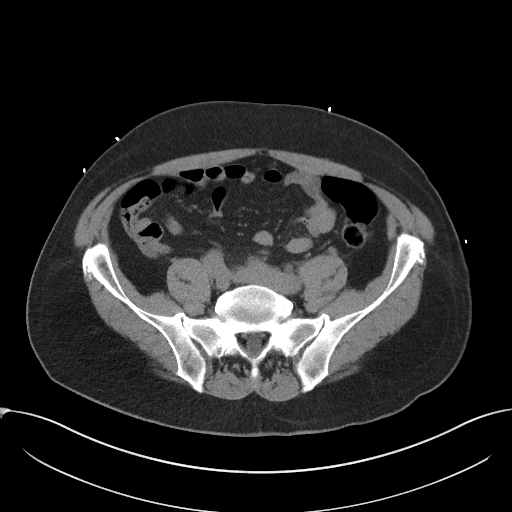
[im 49/97  soft-tissue]
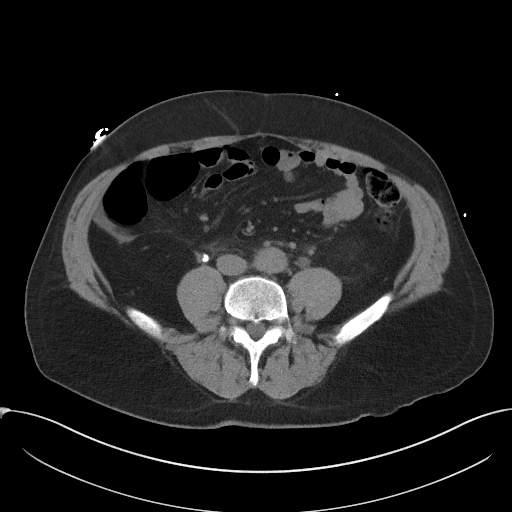
[im 57/97  soft-tissue]
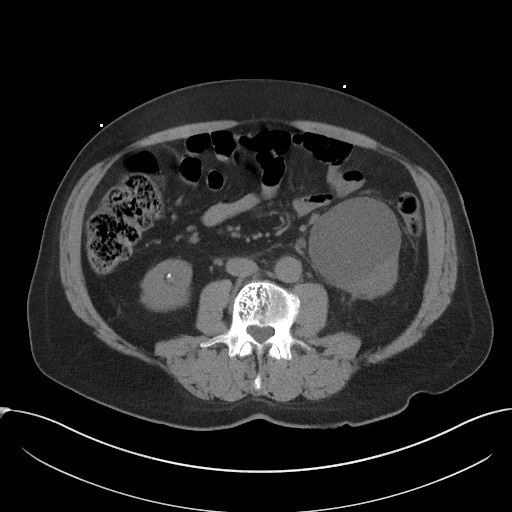
[im 65/97  soft-tissue]
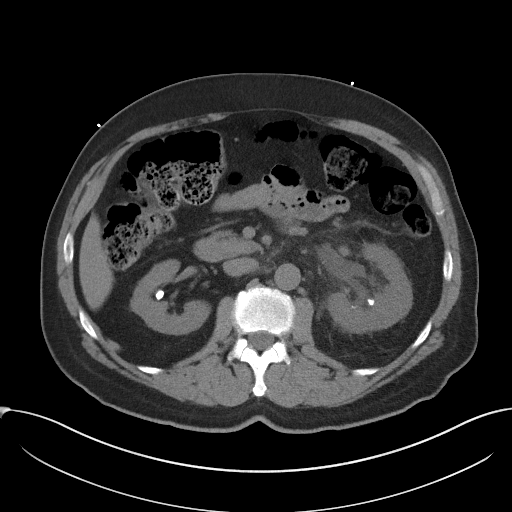
[im 73/97  soft-tissue]
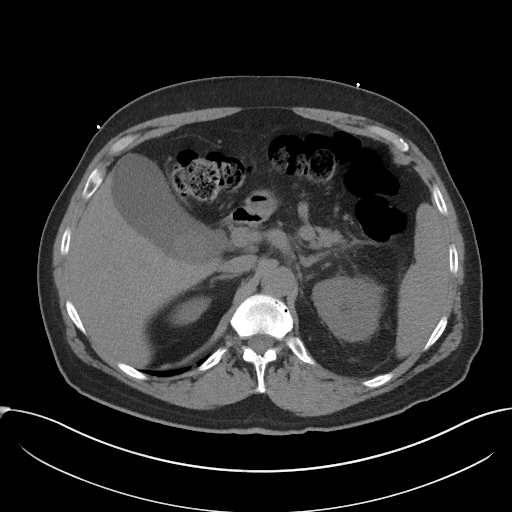
[im 73/97  bone]
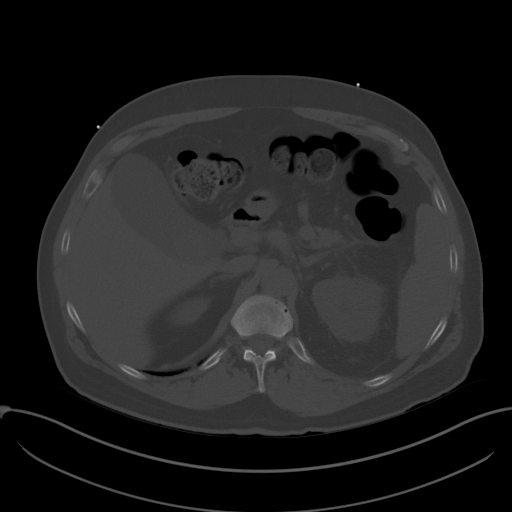
[im 81/97  soft-tissue]
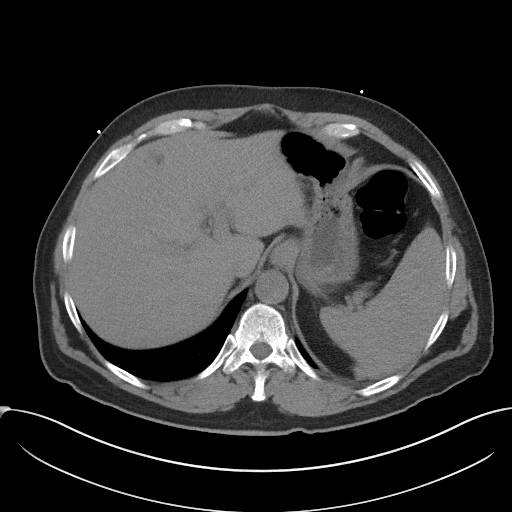
[im 89/97  soft-tissue]
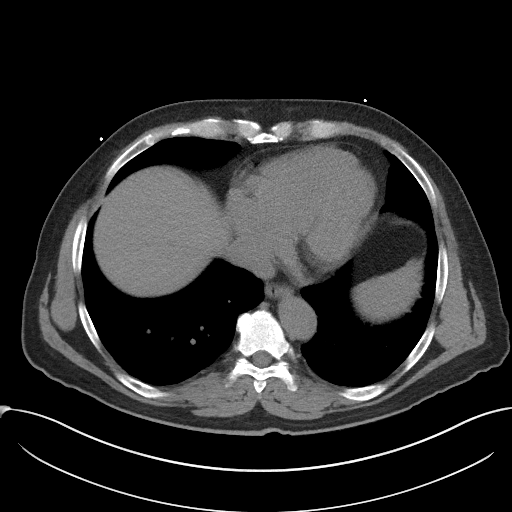

[Series 5: coronal · coronal · 0.83mm/px · 3 of 144 slices shown]
[im 48/144  soft-tissue]
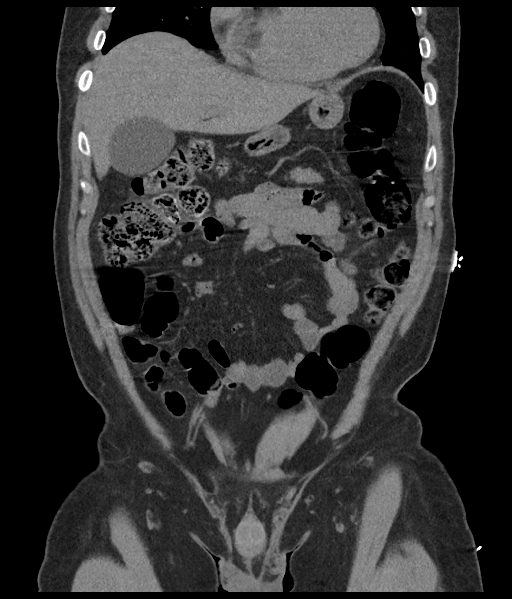
[im 64/144  soft-tissue]
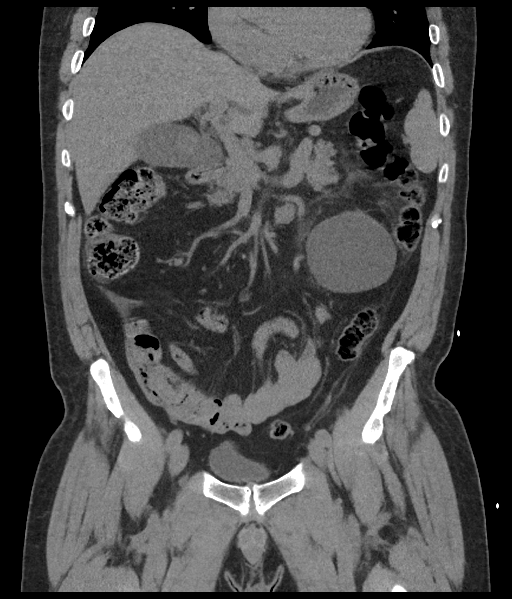
[im 80/144  soft-tissue]
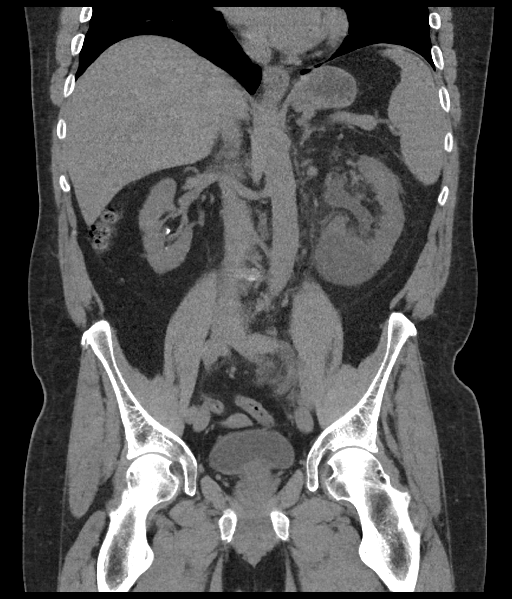

[14 of 46 positions shown; findings below may reference images not displayed]

FINDINGS: Lower chest: Lung bases are clear.  There is a small hiatal hernia.

Hepatobiliary: There is a focal area of decreased attenuation in the
anterior segment of the right lobe of the liver measuring 1.6 x
cm, stable from prior study and likely benign hemangioma. There is a
7 mm probable cyst near the fissure for the ligamentum teres in the
right lobe of the liver anteriorly, stable. No new liver lesions are
evident on this noncontrast enhanced study. There is apparent sludge
in the gallbladder with possible noncalcified gallstones. There is
no gallbladder wall thickening. There is no biliary duct dilatation.

Pancreas: There is no pancreatic mass or inflammatory focus.

Spleen: No splenic lesions are evident.

Adrenals/Urinary Tract: The right adrenal is unremarkable. There is
an 8 x 8 mm apparent adenoma in the left adrenal. There is a cyst
arising from the lower pole of the left kidney measuring 8.0 x 6.2 x
6.3 cm. There is moderate hydronephrosis on the left. There is no
hydronephrosis on the right. On the right, there are multiple
calculi throughout the kidney. The largest calculus in the right
kidney is in the upper pole region measuring 1.3 x 0.8 cm. Other
calculi range in size from as small as 3 mm to as large as 9 mm.
Multiple calculi are noted in the left kidney ranging in size from
as small as 1 mm to as large as 8 mm. There is subtle edema
involving the left kidney. On the left, there is a calculus at the
level of the mid sacrum in the left ureter measuring 1.1 x 0.6 cm.
On the right, there is a calculus at the level of L4-5 measuring 7 x
7 mm which is not causing appreciable hydronephrosis. Note, however,
that there is mild mesenteric thickening and fluid near this
ureteral calculi on the right as well as fluid tracking into the
right lateral mid pelvis. No other ureteral calculi identified.
Urinary bladder is midline with wall thickness within normal limits.

Stomach/Bowel: There is no bowel wall or mesenteric thickening. No
bowel obstruction. No free air or portal venous air.

Vascular/Lymphatic: There is no appreciable abdominal aortic
aneurysm ; the aorta is mildly tortuous in the abdominal region. No
appreciable vascular calcification is evident. There is no
appreciable adenopathy in the abdomen or pelvis.

Reproductive: Prostate appears rather prominent. Contains multiple
calculi. The prostate impresses on the inferior aspect of the
urinary bladder. Seminal vesicles appear normal. Apart from the
prostate, there is no pelvic mass.

Other: There is no periappendiceal region inflammation. No abscess
or ascites evident. There is fat in each inguinal ring.

Musculoskeletal: There is a hemangioma in the L3 vertebral body,
also present on prior study. There is focal disc narrowing at L3-4.
There is a stable sclerotic focus in the left iliac crest, a
possible bone island. No new sclerotic foci identified compared to
prior study. No lytic or destructive bone lesions evident. There is
no abdominal wall or intramuscular lesion.
IMPRESSION: There is a ureteral calculus at the mid sacral level on the left
measuring 1.1 x 0.6 cm which is causing moderate hydronephrosis and
perinephric edema peer

On the right there is a calculus at the L4-5 level measuring 7 x 7
mm with localized. Localized periureteral inflammation but no
appreciable hydronephrosis on the right.

There are multiple intrarenal calculi, slightly more on the right
than on the left.

Prostate is enlarged and abuts the inferior urinary bladder. There
are multiple prostatic calculi. Advise correlation with PSA.

Small left adrenal adenoma, a benign finding.

Sludge and possible nonshadowing calculi in gallbladder. Gallbladder
wall not thickened.

Small hiatal hernia.

No bowel obstruction. No abscess. No periappendiceal region
inflammatory change.

Hemangioma L3 vertebral body.  Bone island left iliac crest, stable.

## 2018-05-25 DIAGNOSIS — M1812 Unilateral primary osteoarthritis of first carpometacarpal joint, left hand: Secondary | ICD-10-CM | POA: Diagnosis not present

## 2018-05-25 DIAGNOSIS — M79641 Pain in right hand: Secondary | ICD-10-CM | POA: Diagnosis not present

## 2018-05-25 DIAGNOSIS — M1811 Unilateral primary osteoarthritis of first carpometacarpal joint, right hand: Secondary | ICD-10-CM | POA: Diagnosis not present

## 2018-06-23 DIAGNOSIS — M545 Low back pain: Secondary | ICD-10-CM | POA: Diagnosis not present

## 2018-06-23 DIAGNOSIS — M5136 Other intervertebral disc degeneration, lumbar region: Secondary | ICD-10-CM | POA: Diagnosis not present

## 2018-06-23 DIAGNOSIS — M9902 Segmental and somatic dysfunction of thoracic region: Secondary | ICD-10-CM | POA: Diagnosis not present

## 2018-06-23 DIAGNOSIS — M9903 Segmental and somatic dysfunction of lumbar region: Secondary | ICD-10-CM | POA: Diagnosis not present

## 2018-06-29 DIAGNOSIS — M545 Low back pain: Secondary | ICD-10-CM | POA: Diagnosis not present

## 2018-06-29 DIAGNOSIS — M5136 Other intervertebral disc degeneration, lumbar region: Secondary | ICD-10-CM | POA: Diagnosis not present

## 2018-06-29 DIAGNOSIS — M9903 Segmental and somatic dysfunction of lumbar region: Secondary | ICD-10-CM | POA: Diagnosis not present

## 2018-06-29 DIAGNOSIS — M9902 Segmental and somatic dysfunction of thoracic region: Secondary | ICD-10-CM | POA: Diagnosis not present

## 2018-07-05 DIAGNOSIS — M9903 Segmental and somatic dysfunction of lumbar region: Secondary | ICD-10-CM | POA: Diagnosis not present

## 2018-07-05 DIAGNOSIS — M9902 Segmental and somatic dysfunction of thoracic region: Secondary | ICD-10-CM | POA: Diagnosis not present

## 2018-07-05 DIAGNOSIS — M5136 Other intervertebral disc degeneration, lumbar region: Secondary | ICD-10-CM | POA: Diagnosis not present

## 2018-07-05 DIAGNOSIS — M545 Low back pain: Secondary | ICD-10-CM | POA: Diagnosis not present

## 2018-07-12 DIAGNOSIS — M5136 Other intervertebral disc degeneration, lumbar region: Secondary | ICD-10-CM | POA: Diagnosis not present

## 2018-07-12 DIAGNOSIS — M9902 Segmental and somatic dysfunction of thoracic region: Secondary | ICD-10-CM | POA: Diagnosis not present

## 2018-07-12 DIAGNOSIS — M9903 Segmental and somatic dysfunction of lumbar region: Secondary | ICD-10-CM | POA: Diagnosis not present

## 2018-07-12 DIAGNOSIS — M545 Low back pain: Secondary | ICD-10-CM | POA: Diagnosis not present

## 2018-07-19 DIAGNOSIS — M545 Low back pain: Secondary | ICD-10-CM | POA: Diagnosis not present

## 2018-07-19 DIAGNOSIS — M9903 Segmental and somatic dysfunction of lumbar region: Secondary | ICD-10-CM | POA: Diagnosis not present

## 2018-07-19 DIAGNOSIS — M5136 Other intervertebral disc degeneration, lumbar region: Secondary | ICD-10-CM | POA: Diagnosis not present

## 2018-07-19 DIAGNOSIS — M9902 Segmental and somatic dysfunction of thoracic region: Secondary | ICD-10-CM | POA: Diagnosis not present

## 2018-07-26 DIAGNOSIS — M545 Low back pain: Secondary | ICD-10-CM | POA: Diagnosis not present

## 2018-07-26 DIAGNOSIS — M9902 Segmental and somatic dysfunction of thoracic region: Secondary | ICD-10-CM | POA: Diagnosis not present

## 2018-07-26 DIAGNOSIS — M9903 Segmental and somatic dysfunction of lumbar region: Secondary | ICD-10-CM | POA: Diagnosis not present

## 2018-07-26 DIAGNOSIS — M5136 Other intervertebral disc degeneration, lumbar region: Secondary | ICD-10-CM | POA: Diagnosis not present

## 2018-08-02 DIAGNOSIS — M545 Low back pain: Secondary | ICD-10-CM | POA: Diagnosis not present

## 2018-08-02 DIAGNOSIS — M9902 Segmental and somatic dysfunction of thoracic region: Secondary | ICD-10-CM | POA: Diagnosis not present

## 2018-08-02 DIAGNOSIS — M9903 Segmental and somatic dysfunction of lumbar region: Secondary | ICD-10-CM | POA: Diagnosis not present

## 2018-08-02 DIAGNOSIS — M5136 Other intervertebral disc degeneration, lumbar region: Secondary | ICD-10-CM | POA: Diagnosis not present

## 2018-08-10 DIAGNOSIS — M9903 Segmental and somatic dysfunction of lumbar region: Secondary | ICD-10-CM | POA: Diagnosis not present

## 2018-08-10 DIAGNOSIS — M545 Low back pain: Secondary | ICD-10-CM | POA: Diagnosis not present

## 2018-08-10 DIAGNOSIS — M5136 Other intervertebral disc degeneration, lumbar region: Secondary | ICD-10-CM | POA: Diagnosis not present

## 2018-08-10 DIAGNOSIS — M9902 Segmental and somatic dysfunction of thoracic region: Secondary | ICD-10-CM | POA: Diagnosis not present

## 2018-11-18 DIAGNOSIS — M9902 Segmental and somatic dysfunction of thoracic region: Secondary | ICD-10-CM | POA: Diagnosis not present

## 2018-11-18 DIAGNOSIS — Z0000 Encounter for general adult medical examination without abnormal findings: Secondary | ICD-10-CM | POA: Diagnosis not present

## 2018-11-18 DIAGNOSIS — Z79899 Other long term (current) drug therapy: Secondary | ICD-10-CM | POA: Diagnosis not present

## 2018-11-18 DIAGNOSIS — Z125 Encounter for screening for malignant neoplasm of prostate: Secondary | ICD-10-CM | POA: Diagnosis not present

## 2018-11-18 DIAGNOSIS — E7800 Pure hypercholesterolemia, unspecified: Secondary | ICD-10-CM | POA: Diagnosis not present

## 2018-11-18 DIAGNOSIS — M545 Low back pain: Secondary | ICD-10-CM | POA: Diagnosis not present

## 2018-11-18 DIAGNOSIS — M9903 Segmental and somatic dysfunction of lumbar region: Secondary | ICD-10-CM | POA: Diagnosis not present

## 2018-11-18 DIAGNOSIS — M5136 Other intervertebral disc degeneration, lumbar region: Secondary | ICD-10-CM | POA: Diagnosis not present

## 2018-11-25 DIAGNOSIS — M5136 Other intervertebral disc degeneration, lumbar region: Secondary | ICD-10-CM | POA: Diagnosis not present

## 2018-11-25 DIAGNOSIS — M9903 Segmental and somatic dysfunction of lumbar region: Secondary | ICD-10-CM | POA: Diagnosis not present

## 2018-11-25 DIAGNOSIS — M9902 Segmental and somatic dysfunction of thoracic region: Secondary | ICD-10-CM | POA: Diagnosis not present

## 2018-11-25 DIAGNOSIS — M545 Low back pain: Secondary | ICD-10-CM | POA: Diagnosis not present

## 2018-12-13 DIAGNOSIS — M545 Low back pain: Secondary | ICD-10-CM | POA: Diagnosis not present

## 2018-12-13 DIAGNOSIS — M9903 Segmental and somatic dysfunction of lumbar region: Secondary | ICD-10-CM | POA: Diagnosis not present

## 2018-12-13 DIAGNOSIS — M5136 Other intervertebral disc degeneration, lumbar region: Secondary | ICD-10-CM | POA: Diagnosis not present

## 2018-12-13 DIAGNOSIS — M9902 Segmental and somatic dysfunction of thoracic region: Secondary | ICD-10-CM | POA: Diagnosis not present

## 2018-12-27 DIAGNOSIS — M545 Low back pain: Secondary | ICD-10-CM | POA: Diagnosis not present

## 2018-12-27 DIAGNOSIS — M9902 Segmental and somatic dysfunction of thoracic region: Secondary | ICD-10-CM | POA: Diagnosis not present

## 2018-12-27 DIAGNOSIS — M5136 Other intervertebral disc degeneration, lumbar region: Secondary | ICD-10-CM | POA: Diagnosis not present

## 2018-12-27 DIAGNOSIS — M9903 Segmental and somatic dysfunction of lumbar region: Secondary | ICD-10-CM | POA: Diagnosis not present

## 2019-01-10 DIAGNOSIS — M9902 Segmental and somatic dysfunction of thoracic region: Secondary | ICD-10-CM | POA: Diagnosis not present

## 2019-01-10 DIAGNOSIS — M545 Low back pain: Secondary | ICD-10-CM | POA: Diagnosis not present

## 2019-01-10 DIAGNOSIS — M5136 Other intervertebral disc degeneration, lumbar region: Secondary | ICD-10-CM | POA: Diagnosis not present

## 2019-01-10 DIAGNOSIS — M9903 Segmental and somatic dysfunction of lumbar region: Secondary | ICD-10-CM | POA: Diagnosis not present

## 2019-01-24 DIAGNOSIS — J0100 Acute maxillary sinusitis, unspecified: Secondary | ICD-10-CM | POA: Diagnosis not present

## 2019-01-24 DIAGNOSIS — J208 Acute bronchitis due to other specified organisms: Secondary | ICD-10-CM | POA: Diagnosis not present

## 2019-01-28 DIAGNOSIS — M5136 Other intervertebral disc degeneration, lumbar region: Secondary | ICD-10-CM | POA: Diagnosis not present

## 2019-01-28 DIAGNOSIS — M9903 Segmental and somatic dysfunction of lumbar region: Secondary | ICD-10-CM | POA: Diagnosis not present

## 2019-01-28 DIAGNOSIS — M9902 Segmental and somatic dysfunction of thoracic region: Secondary | ICD-10-CM | POA: Diagnosis not present

## 2019-01-28 DIAGNOSIS — M545 Low back pain: Secondary | ICD-10-CM | POA: Diagnosis not present

## 2019-02-04 DIAGNOSIS — M9903 Segmental and somatic dysfunction of lumbar region: Secondary | ICD-10-CM | POA: Diagnosis not present

## 2019-02-04 DIAGNOSIS — M5136 Other intervertebral disc degeneration, lumbar region: Secondary | ICD-10-CM | POA: Diagnosis not present

## 2019-02-04 DIAGNOSIS — M9902 Segmental and somatic dysfunction of thoracic region: Secondary | ICD-10-CM | POA: Diagnosis not present

## 2019-02-04 DIAGNOSIS — M545 Low back pain: Secondary | ICD-10-CM | POA: Diagnosis not present

## 2019-02-11 DIAGNOSIS — M545 Low back pain: Secondary | ICD-10-CM | POA: Diagnosis not present

## 2019-02-11 DIAGNOSIS — M9903 Segmental and somatic dysfunction of lumbar region: Secondary | ICD-10-CM | POA: Diagnosis not present

## 2019-02-11 DIAGNOSIS — M5136 Other intervertebral disc degeneration, lumbar region: Secondary | ICD-10-CM | POA: Diagnosis not present

## 2019-02-11 DIAGNOSIS — M9902 Segmental and somatic dysfunction of thoracic region: Secondary | ICD-10-CM | POA: Diagnosis not present

## 2019-02-17 DIAGNOSIS — M9903 Segmental and somatic dysfunction of lumbar region: Secondary | ICD-10-CM | POA: Diagnosis not present

## 2019-02-17 DIAGNOSIS — M545 Low back pain: Secondary | ICD-10-CM | POA: Diagnosis not present

## 2019-02-17 DIAGNOSIS — M5136 Other intervertebral disc degeneration, lumbar region: Secondary | ICD-10-CM | POA: Diagnosis not present

## 2019-02-17 DIAGNOSIS — M9902 Segmental and somatic dysfunction of thoracic region: Secondary | ICD-10-CM | POA: Diagnosis not present

## 2019-02-25 DIAGNOSIS — M545 Low back pain: Secondary | ICD-10-CM | POA: Diagnosis not present

## 2019-02-25 DIAGNOSIS — M9902 Segmental and somatic dysfunction of thoracic region: Secondary | ICD-10-CM | POA: Diagnosis not present

## 2019-02-25 DIAGNOSIS — M5136 Other intervertebral disc degeneration, lumbar region: Secondary | ICD-10-CM | POA: Diagnosis not present

## 2019-02-25 DIAGNOSIS — M9903 Segmental and somatic dysfunction of lumbar region: Secondary | ICD-10-CM | POA: Diagnosis not present

## 2019-10-17 DIAGNOSIS — M720 Palmar fascial fibromatosis [Dupuytren]: Secondary | ICD-10-CM | POA: Diagnosis not present

## 2019-10-17 DIAGNOSIS — M79641 Pain in right hand: Secondary | ICD-10-CM | POA: Diagnosis not present

## 2019-10-17 DIAGNOSIS — M180 Bilateral primary osteoarthritis of first carpometacarpal joints: Secondary | ICD-10-CM | POA: Diagnosis not present

## 2019-10-17 DIAGNOSIS — M79642 Pain in left hand: Secondary | ICD-10-CM | POA: Diagnosis not present

## 2019-11-21 DIAGNOSIS — E7800 Pure hypercholesterolemia, unspecified: Secondary | ICD-10-CM | POA: Diagnosis not present

## 2019-11-21 DIAGNOSIS — Z79899 Other long term (current) drug therapy: Secondary | ICD-10-CM | POA: Diagnosis not present

## 2019-11-21 DIAGNOSIS — Z0000 Encounter for general adult medical examination without abnormal findings: Secondary | ICD-10-CM | POA: Diagnosis not present

## 2019-11-21 DIAGNOSIS — Z125 Encounter for screening for malignant neoplasm of prostate: Secondary | ICD-10-CM | POA: Diagnosis not present

## 2020-02-06 DIAGNOSIS — M180 Bilateral primary osteoarthritis of first carpometacarpal joints: Secondary | ICD-10-CM | POA: Diagnosis not present

## 2021-01-09 ENCOUNTER — Telehealth: Payer: Self-pay

## 2021-01-09 NOTE — Telephone Encounter (Signed)
Patient called stating that yesterday he started having lower quad/back pain and some urinary symptoms odor and dark color. He believes that he is passing another kidney stone and would like to be seen. It has been 3 years since his last appointment. He was offered next available Friday this week with Dr. Erlene Quan, patient is agreeable. He will go for UA and KUB one hour prior. Patient was instructed to utilize OTC NSAIDs for pain and increase fluids. If his pain becomes uncontrollable or if he develops fever, chills, nausea or vomiting he should contact PCP, urgent care or ER. Patient verbalized understanding

## 2021-01-11 ENCOUNTER — Ambulatory Visit: Payer: Self-pay | Admitting: Urology

## 2021-08-08 ENCOUNTER — Ambulatory Visit: Payer: Medicare HMO | Admitting: Dermatology

## 2021-08-08 ENCOUNTER — Other Ambulatory Visit: Payer: Self-pay

## 2021-08-08 DIAGNOSIS — L821 Other seborrheic keratosis: Secondary | ICD-10-CM

## 2021-08-08 DIAGNOSIS — D1800 Hemangioma unspecified site: Secondary | ICD-10-CM

## 2021-08-08 DIAGNOSIS — L820 Inflamed seborrheic keratosis: Secondary | ICD-10-CM | POA: Diagnosis not present

## 2021-08-08 DIAGNOSIS — D229 Melanocytic nevi, unspecified: Secondary | ICD-10-CM

## 2021-08-08 DIAGNOSIS — L814 Other melanin hyperpigmentation: Secondary | ICD-10-CM | POA: Diagnosis not present

## 2021-08-08 DIAGNOSIS — L570 Actinic keratosis: Secondary | ICD-10-CM

## 2021-08-08 NOTE — Progress Notes (Signed)
New Patient Visit  Subjective  Edward Petersen is a 70 y.o. male who presents for the following: Spot Check (Pt states that he has spots on his back that he would like checked today. ).  Objective  Well appearing patient in no apparent distress; mood and affect are within normal limits.  A focused examination was performed including face, ears, nose, back, right hand. Relevant physical exam findings are noted in the Assessment and Plan.  right back x 2, right hand x 1 (3) Erythematous keratotic or waxy stuck-on papule or plaque.   Left Ear x 1, right ear x 1, nose x 1 (3) Erythematous thin papules/macules with gritty scale.   Assessment & Plan  Inflamed seborrheic keratosis right back x 2, right hand x 1  Prior to procedure, discussed risks of blister formation, small wound, skin dyspigmentation, or rare scar following cryotherapy. Recommend Vaseline ointment to treated areas while healing.   Destruction of lesion - right back x 2, right hand x 1 Complexity: simple   Destruction method: cryotherapy   Informed consent: discussed and consent obtained   Timeout:  patient name, date of birth, surgical site, and procedure verified Lesion destroyed using liquid nitrogen: Yes   Region frozen until ice ball extended beyond lesion: Yes   Outcome: patient tolerated procedure well with no complications   Post-procedure details: wound care instructions given    AK (actinic keratosis) (3) Left Ear x 1, right ear x 1, nose x 1  Actinic keratoses are precancerous spots that appear secondary to cumulative UV radiation exposure/sun exposure over time. They are chronic with expected duration over 1 year. A portion of actinic keratoses will progress to squamous cell carcinoma of the skin. It is not possible to reliably predict which spots will progress to skin cancer and so treatment is recommended to prevent development of skin cancer.  Recommend daily broad spectrum sunscreen SPF 30+ to  sun-exposed areas, reapply every 2 hours as needed.  Recommend staying in the shade or wearing long sleeves, sun glasses (UVA+UVB protection) and wide brim hats (4-inch brim around the entire circumference of the hat). Call for new or changing lesions.  Prior to procedure, discussed risks of blister formation, small wound, skin dyspigmentation, or rare scar following cryotherapy. Recommend Vaseline ointment to treated areas while healing.   Destruction of lesion - Left Ear x 1, right ear x 1, nose x 1 Complexity: simple   Destruction method: cryotherapy   Informed consent: discussed and consent obtained   Timeout:  patient name, date of birth, surgical site, and procedure verified Lesion destroyed using liquid nitrogen: Yes   Region frozen until ice ball extended beyond lesion: Yes   Outcome: patient tolerated procedure well with no complications   Post-procedure details: wound care instructions given    Seborrheic Keratoses - Stuck-on, waxy, tan-brown papules and/or plaques  - Benign-appearing - Discussed benign etiology and prognosis. - Observe - Call for any changes  Melanocytic Nevi - Tan-brown and/or pink-flesh-colored symmetric macules and papules - Benign appearing on exam today - Observation - Call clinic for new or changing moles - Recommend daily use of broad spectrum spf 30+ sunscreen to sun-exposed areas.   Hemangiomas - Red papules - Discussed benign nature - Observe - Call for any changes  Lentigines - Scattered tan macules - Due to sun exposure - Benign-appering, observe - Recommend daily broad spectrum sunscreen SPF 30+ to sun-exposed areas, reapply every 2 hours as needed. - Call for any changes  Return if symptoms worsen or fail to improve. IHarriett Sine, CMA, am acting as scribe for Sarina Ser, MD. Documentation: I have reviewed the above documentation for accuracy and completeness, and I agree with the above.  Sarina Ser, MD

## 2021-08-08 NOTE — Patient Instructions (Signed)
Cryotherapy Aftercare  . Wash gently with soap and water everyday.   . Apply Vaseline and Band-Aid daily until healed.  

## 2021-08-15 ENCOUNTER — Encounter: Payer: Self-pay | Admitting: Dermatology

## 2022-08-13 ENCOUNTER — Ambulatory Visit: Payer: Managed Care, Other (non HMO) | Admitting: Dermatology

## 2022-10-22 ENCOUNTER — Ambulatory Visit: Payer: Medicare HMO | Admitting: Dermatology

## 2022-10-22 ENCOUNTER — Encounter: Payer: Self-pay | Admitting: Dermatology

## 2022-10-22 DIAGNOSIS — L578 Other skin changes due to chronic exposure to nonionizing radiation: Secondary | ICD-10-CM

## 2022-10-22 DIAGNOSIS — B078 Other viral warts: Secondary | ICD-10-CM

## 2022-10-22 DIAGNOSIS — L570 Actinic keratosis: Secondary | ICD-10-CM | POA: Diagnosis not present

## 2022-10-22 NOTE — Progress Notes (Signed)
Follow-Up Visit   Subjective  Edward Petersen is a 71 y.o. male who presents for the following: lesions (C/O lesions on face that burn and sting. Rough area on back that Dr. Nehemiah Massed treated last year is not completely gone).  The patient has spots, moles and lesions to be evaluated, some may be new or changing and the patient has concerns that these could be cancer.   The following portions of the chart were reviewed this encounter and updated as appropriate:      Review of Systems: No other skin or systemic complaints except as noted in HPI or Assessment and Plan.   Objective  Well appearing patient in no apparent distress; mood and affect are within normal limits.  A focused examination was performed including face, back, hands. Relevant physical exam findings are noted in the Assessment and Plan.  Right Hand - Dorsum x7 (7) Firm flesh scaly papules  right cheek x5, left mid ear helix x1, left cheek x3 (9) Pink/brown scaly macules   Assessment & Plan  Other viral warts (7) Right Hand - Dorsum x7  Vs prurigo nodules vs ISKs  Viral Wart (HPV) Counseling  Discussed viral / HPV (Human Papilloma Virus) etiology and risk of spread /infectivity to other areas of body as well as to other people.  Multiple treatments and methods may be required to clear warts and it is possible treatment may not be successful.  Treatment risks include discoloration; scarring and there is still potential for wart recurrence.  Destruction of lesion - Right Hand - Dorsum x7  Destruction method: cryotherapy   Informed consent: discussed and consent obtained   Lesion destroyed using liquid nitrogen: Yes   Region frozen until ice ball extended beyond lesion: Yes   Outcome: patient tolerated procedure well with no complications   Post-procedure details: wound care instructions given   Additional details:  Prior to procedure, discussed risks of blister formation, small wound, skin dyspigmentation, or  rare scar following cryotherapy. Recommend Vaseline ointment to treated areas while healing.   Actinic keratosis (9) right cheek x5, left mid ear helix x1, left cheek x3  Vs ISKs  Symptomatic, irritating, patient would like treated. Sting. Irritated by shaving  Destruction of lesion - right cheek x5, left mid ear helix x1, left cheek x3  Destruction method: cryotherapy   Informed consent: discussed and consent obtained   Lesion destroyed using liquid nitrogen: Yes   Region frozen until ice ball extended beyond lesion: Yes   Outcome: patient tolerated procedure well with no complications   Post-procedure details: wound care instructions given   Additional details:  Prior to procedure, discussed risks of blister formation, small wound, skin dyspigmentation, or rare scar following cryotherapy. Recommend Vaseline ointment to treated areas while healing.    Seborrheic Keratoses. Back - Stuck-on, waxy, tan-brown papules and/or plaques.  Previously treated ISK R lower back has recurred, but is not bothersome to patient - Benign-appearing - Discussed benign etiology and prognosis. - Observe - Call for any changes  Actinic Damage with PreCancerous Actinic Keratoses Counseling for Topical Chemotherapy Management: Patient exhibits: - Severe, confluent actinic changes with pre-cancerous actinic keratoses that is secondary to cumulative UV radiation exposure over time - Condition that is severe; chronic, not at goal. - diffuse scaly erythematous macules and papules with underlying dyspigmentation - Discussed Prescription "Field Treatment" topical Chemotherapy for Severe, Chronic Confluent Actinic Changes with Pre-Cancerous Actinic Keratoses Field treatment involves treatment of an entire area of skin that has confluent  Actinic Changes (Sun/ Ultraviolet light damage) and PreCancerous Actinic Keratoses by method of PhotoDynamic Therapy (PDT) and/or prescription Topical Chemotherapy agents such as  5-fluorouracil, 5-fluorouracil/calcipotriene, and/or imiquimod.  The purpose is to decrease the number of clinically evident and subclinical PreCancerous lesions to prevent progression to development of skin cancer by chemically destroying early precancer changes that may or may not be visible.  It has been shown to reduce the risk of developing skin cancer in the treated area. As a result of treatment, redness, scaling, crusting, and open sores may occur during treatment course. One or more than one of these methods may be used and may have to be used several times to control, suppress and eliminate the PreCancerous changes. Discussed treatment course, expected reaction, and possible side effects. - Recommend daily broad spectrum sunscreen SPF 30+ to sun-exposed areas, reapply every 2 hours as needed.  - Staying in the shade or wearing long sleeves, sun glasses (UVA+UVB protection) and wide brim hats (4-inch brim around the entire circumference of the hat) are also recommended. - Call for new or changing lesions.  Recommend PDT, Red Light Treatment on face with debridement by Dr. Chauncey Cruel   Return for PDT/Red Light Next Available, AK Follow Up in 6 months.  I, Emelia Salisbury, CMA, am acting as scribe for Brendolyn Patty, MD.  Documentation: I have reviewed the above documentation for accuracy and completeness, and I agree with the above.  Brendolyn Patty MD

## 2022-10-22 NOTE — Patient Instructions (Addendum)
Cryotherapy Aftercare  Wash gently with soap and water everyday.   Apply Vaseline daily until healed.     Plastic surgeon recommendations:  [facility]  [address] [place], [place] [address] Phone: [phone]  [address], [place], [place] [address] Phone: [phone]  Dr. Nicholaus Bloom Address: [address] [place], [place] [address] Phone: [phone]   Recommend daily broad spectrum sunscreen SPF 30+ to sun-exposed areas, reapply every 2 hours as needed. Call for new or changing lesions.  Staying in the shade or wearing long sleeves, sun glasses (UVA+UVB protection) and wide brim hats (4-inch brim around the entire circumference of the hat) are also recommended for sun protection.    Due to recent changes in healthcare laws, you may see results of your pathology and/or laboratory studies on MyChart before the doctors have had a chance to review them. We understand that in some cases there may be results that are confusing or concerning to you. Please understand that not all results are received at the same time and often the doctors may need to interpret multiple results in order to provide you with the best plan of care or course of treatment. Therefore, we ask that you please give [place] 2 business days to thoroughly review all your results before contacting the office for clarification. Should we see a critical lab result, you will be contacted sooner.   If You Need Anything After Your Visit  If you have any questions or concerns for your doctor, please call our main line at [phone] and press option 4 to reach your doctor's medical assistant. If no one answers, please leave a voicemail as directed and we will return your call as soon as possible. Messages left after 4 pm will be answered the following business day.   You may also send [place] a message via [place]. We typically respond to MyChart  messages within 1-2 business days.  For prescription refills, please ask your pharmacy to contact our office. Our fax number is [phone].  If you have an urgent issue when the clinic is closed that cannot wait until the next business day, you can page your doctor at the number below.    Please note that while we do our best to be available for urgent issues outside of office hours, we are not available 24/7.   If you have an urgent issue and are unable to reach [place], you may choose to seek medical care at your doctor's office, retail clinic, urgent care center, or emergency room.  If you have a medical emergency, please immediately call 911 or go to the emergency department.  Pager Numbers  - Dr. Nehemiah Massed: [phone]  - Dr. Laurence Ferrari: [phone]  - Dr. Nicole Kindred: [phone]  In the event of inclement weather, please call our main line at [phone] for an update on the status of any delays or closures.  Dermatology Medication Tips: Please keep the boxes that topical medications come in in order to help keep track of the instructions about where and how to use these. Pharmacies typically print the medication instructions only on the boxes and not directly on the medication tubes.   If your medication is too expensive, please contact our office at [phone] option 4 or send [place] a message through [place].   We are unable to tell what your co-pay for medications will be in advance as this is different depending on your insurance coverage.  However, we may be able to find a substitute medication at lower cost or fill out paperwork to get insurance to cover a needed medication.   If a prior authorization is required to get your medication covered by your insurance company, please allow [place] 1-2 business days to complete this process.  Drug prices often vary depending on where the prescription is filled and some pharmacies may offer cheaper prices.  The website www.goodrx.com contains  coupons for medications through different pharmacies. The prices here do not account for what the cost may be with help from insurance (it may be cheaper with your insurance), but the website can give you the price if you did not use any insurance.  - You can print the associated coupon and take it with your prescription to the pharmacy.  - You may also stop by our office during regular business hours and pick up a GoodRx coupon card.  - If you need your prescription sent electronically to a different pharmacy, notify our office through [facility] or by phone at [phone] option 4.     Si Usted Necesita Algo Despus de Su Visita  Tambin puede enviarnos un mensaje a travs de Pharmacist, community. Por lo general respondemos a los mensajes de MyChart en el transcurso de 1 a 2 das hbiles.  Para renovar recetas, por favor pida a su farmacia que se ponga en contacto con nuestra oficina. Harland Dingwall de fax es [place] [phone].  Si tiene un asunto urgente cuando la clnica est cerrada y que no puede esperar hasta el siguiente da hbil, puede llamar/localizar a su doctor(a) al nmero que aparece a continuacin.   Por favor, tenga en cuenta que aunque hacemos todo lo posible para estar disponibles para asuntos urgentes fuera del horario de [place], no estamos disponibles las 24 horas del da, los 7 das de la [place].   Si tiene un problema urgente y no puede comunicarse con nosotros, puede optar por buscar atencin mdica  en el consultorio de su doctor(a), en una clnica privada, en un centro de atencin urgente o en una sala de emergencias.  Si tiene Engineering geologist, por favor llame inmediatamente al 911 o vaya a la sala de emergencias.  Nmeros de bper  - Dr. Nehemiah Massed: [phone]  - Dra. Moye: [phone]  - Dra. Nicole Kindred: [phone]  En caso de inclemencias del [place], por favor llame a Johnsie Kindred principal al [phone] para una actualizacin sobre el [place] de  cualquier retraso o cierre.  Consejos para la medicacin en dermatologa: Por favor, guarde las cajas en las que vienen los medicamentos de uso tpico para ayudarle a seguir las instrucciones sobre dnde y cmo usarlos. Las farmacias generalmente imprimen las instrucciones del medicamento slo en las cajas y no directamente en los tubos del [place].   Si su medicamento es muy caro, por favor, pngase en contacto con Zigmund Daniel llamando al [phone] y presione la opcin 4 o envenos un mensaje a travs de Pharmacist, community.   No podemos decirle cul ser su copago por los medicamentos por adelantado ya que esto es diferente dependiendo de la cobertura de su seguro. Sin embargo, es posible que podamos encontrar un medicamento sustituto a Electrical engineer un formulario para que el seguro cubra el medicamento que se considera necesario.   Si se requiere una autorizacin previa para que su compaa de seguros [place] su medicamento, por favor permtanos de 1 a 2 das hbiles para completar este proceso.  Los precios de  los medicamentos varan con frecuencia dependiendo del lugar de dnde se surte la receta y alguna farmacias pueden ofrecer precios ms baratos.  El sitio web www.goodrx.com tiene cupones para medicamentos de Airline pilot. Los precios aqu no tienen en cuenta lo que podra costar con la ayuda del seguro (puede ser ms barato con su seguro), pero el sitio web puede darle el precio si no utiliz Research scientist (physical sciences).  - Puede imprimir el cupn correspondiente y llevarlo con su receta a la farmacia.  - Tambin puede pasar por nuestra oficina durante el horario de atencin regular y Charity fundraiser una tarjeta de cupones de GoodRx.  - Si necesita que su receta se enve electrnicamente a una farmacia diferente, informe a nuestra oficina a travs de MyChart de [place] o por telfono llamando al [phone] y presione la opcin 4.

## 2023-05-05 ENCOUNTER — Ambulatory Visit: Payer: Medicare HMO | Admitting: Dermatology

## 2023-05-05 VITALS — BP 128/81 | HR 61

## 2023-05-05 DIAGNOSIS — D692 Other nonthrombocytopenic purpura: Secondary | ICD-10-CM

## 2023-05-05 DIAGNOSIS — W908XXA Exposure to other nonionizing radiation, initial encounter: Secondary | ICD-10-CM

## 2023-05-05 DIAGNOSIS — X32XXXA Exposure to sunlight, initial encounter: Secondary | ICD-10-CM | POA: Diagnosis not present

## 2023-05-05 DIAGNOSIS — L578 Other skin changes due to chronic exposure to nonionizing radiation: Secondary | ICD-10-CM | POA: Diagnosis not present

## 2023-05-05 DIAGNOSIS — L814 Other melanin hyperpigmentation: Secondary | ICD-10-CM

## 2023-05-05 DIAGNOSIS — L821 Other seborrheic keratosis: Secondary | ICD-10-CM

## 2023-05-05 DIAGNOSIS — L570 Actinic keratosis: Secondary | ICD-10-CM | POA: Diagnosis not present

## 2023-05-05 NOTE — Patient Instructions (Addendum)
Cryotherapy Aftercare  Wash gently with soap and water everyday.   Apply Vaseline and Band-Aid daily until healed.    Recommend daily broad spectrum sunscreen SPF 30+ to sun-exposed areas, reapply every 2 hours as needed. Call for new or changing lesions.  Staying in the shade or wearing long sleeves, sun glasses (UVA+UVB protection) and wide brim hats (4-inch brim around the entire circumference of the hat) are also recommended for sun protection.     Due to recent changes in healthcare laws, you may see results of your pathology and/or laboratory studies on MyChart before the doctors have had a chance to review them. We understand that in some cases there may be results that are confusing or concerning to you. Please understand that not all results are received at the same time and often the doctors may need to interpret multiple results in order to provide you with the best plan of care or course of treatment. Therefore, we ask that you please give us 2 business days to thoroughly review all your results before contacting the office for clarification. Should we see a critical lab result, you will be contacted sooner.   If You Need Anything After Your Visit  If you have any questions or concerns for your doctor, please call our main line at [phone] and press option 4 to reach your doctor's medical assistant. If no one answers, please leave a voicemail as directed and we will return your call as soon as possible. Messages left after 4 pm will be answered the following business day.   You may also send us a message via MyChart. We typically respond to MyChart messages within 1-2 business days.  For prescription refills, please ask your pharmacy to contact our office. Our fax number is [phone].  If you have an urgent issue when the clinic is closed that cannot wait until the next business day, you can page your doctor at the number below.    Please note that while we do our best to  be available for urgent issues outside of office hours, we are not available 24/7.   If you have an urgent issue and are unable to reach us, you may choose to seek medical care at your doctor's office, retail clinic, urgent care center, or emergency room.  If you have a medical emergency, please immediately call 911 or go to the emergency department.  Pager Numbers  - Dr. Kowalski: [phone]  - Dr. Moye: [phone]  - Dr. Stewart: [phone]  In the event of inclement weather, please call our main line at [phone] for an update on the status of any delays or closures.  Dermatology Medication Tips: Please keep the boxes that topical medications come in in order to help keep track of the instructions about where and how to use these. Pharmacies typically print the medication instructions only on the boxes and not directly on the medication tubes.   If your medication is too expensive, please contact our office at [phone] option 4 or send us a message through MyChart.   We are unable to tell what your co-pay for medications will be in advance as this is different depending on your insurance coverage. However, we may be able to find a substitute medication at lower cost or fill out paperwork to get insurance to cover a needed medication.   If a prior authorization is required to get your medication covered by your insurance company, please allow us 1-2 business days to complete this process.    Drug prices often vary depending on where the prescription is filled and some pharmacies may offer cheaper prices.  The website www.goodrx.com contains coupons for medications through different pharmacies. The prices here do not account for what the cost may be with help from insurance (it may be cheaper with your insurance), but the website can give you the price if you did not use any insurance.  - You can print the associated coupon and take it with your prescription to the pharmacy.   - You may also stop by our office during regular business hours and pick up a GoodRx coupon card.  - If you need your prescription sent electronically to a different pharmacy, notify our office through [place] MyChart or by phone at [phone] option 4.     Si Usted Necesita Algo Despus de Su Visita  Tambin puede enviarnos un mensaje a travs de MyChart. Por lo general respondemos a los mensajes de MyChart en el transcurso de 1 a 2 das hbiles.  Para renovar recetas, por favor pida a su farmacia que se ponga en contacto con nuestra oficina. Nuestro nmero de fax es el [phone].  Si tiene un asunto urgente cuando la clnica est cerrada y que no puede esperar hasta el siguiente da hbil, puede llamar/localizar a su doctor(a) al nmero que aparece a continuacin.   Por favor, tenga en cuenta que aunque hacemos todo lo posible para estar disponibles para asuntos urgentes fuera del horario de oficina, no estamos disponibles las 24 horas del da, los 7 das de la semana.   Si tiene un problema urgente y no puede comunicarse con nosotros, puede optar por buscar atencin mdica  en el consultorio de su doctor(a), en una clnica privada, en un centro de atencin urgente o en una sala de emergencias.  Si tiene una emergencia mdica, por favor llame inmediatamente al 911 o vaya a la sala de emergencias.  Nmeros de bper  - Dr. Kowalski: [phone]  - Dra. Moye: [phone]  - Dra. Stewart: [phone]  En caso de inclemencias del tiempo, por favor llame a nuestra lnea principal al [phone] para una actualizacin sobre el estado de cualquier retraso o cierre.  Consejos para la medicacin en dermatologa: Por favor, guarde las cajas en las que vienen los medicamentos de uso tpico para ayudarle a seguir las instrucciones sobre dnde y cmo usarlos. Las farmacias generalmente imprimen las instrucciones del medicamento slo en las cajas y no directamente en los tubos del  medicamento.   Si su medicamento es muy caro, por favor, pngase en contacto con nuestra oficina llamando al [phone] y presione la opcin 4 o envenos un mensaje a travs de MyChart.   No podemos decirle cul ser su copago por los medicamentos por adelantado ya que esto es diferente dependiendo de la cobertura de su seguro. Sin embargo, es posible que podamos encontrar un medicamento sustituto a menor costo o llenar un formulario para que el seguro cubra el medicamento que se considera necesario.   Si se requiere una autorizacin previa para que su compaa de seguros cubra su medicamento, por favor permtanos de 1 a 2 das hbiles para completar este proceso.  Los precios de los medicamentos varan con frecuencia dependiendo del lugar de dnde se surte la receta y alguna farmacias pueden ofrecer precios ms baratos.  El sitio web www.goodrx.com tiene cupones para medicamentos de diferentes farmacias. Los precios aqu no tienen en cuenta lo que podra costar con la ayuda del seguro (puede ser ms   barato con su seguro), pero el sitio web puede darle el precio si no utiliz ningn seguro.  - Puede imprimir el cupn correspondiente y llevarlo con su receta a la farmacia.  - Tambin puede pasar por nuestra oficina durante el horario de atencin regular y recoger una tarjeta de cupones de GoodRx.  - Si necesita que su receta se enve electrnicamente a una farmacia diferente, informe a nuestra oficina a travs de MyChart de [place] o por telfono llamando al [phone] y presione la opcin 4.  

## 2023-05-05 NOTE — Progress Notes (Signed)
Follow-Up Visit   Subjective  Edward Petersen is a 72 y.o. male who presents for the following: 6 month follow-up AKs of the face, hands. He has a scaly growth on the back that he scratches at times.   The following portions of the chart were reviewed this encounter and updated as appropriate: medications, allergies, medical history  Review of Systems:  No other skin or systemic complaints except as noted in HPI or Assessment and Plan.  Objective  Well appearing patient in no apparent distress; mood and affect are within normal limits.  A focused examination was performed of the following areas: Face, arms, back.  Relevant exam findings are noted in the Assessment and Plan.  R earlobe x 1, R preauricular x 2, L lower temple x 1, L sideburn x 1, L preauricular x 1, L earlobe x 1, L malar cheek x 1, L ear helix x 1, R forehead x 1 (10) Pink scaly macules.    Assessment & Plan  ACTINIC DAMAGE WITH PRECANCEROUS ACTINIC KERATOSES Counseling for Topical Chemotherapy Management: Patient exhibits: - Severe, confluent actinic changes with pre-cancerous actinic keratoses that is secondary to cumulative UV radiation exposure over time - Condition that is severe; chronic, not at goal. - diffuse scaly erythematous macules and papules with underlying dyspigmentation - Discussed Prescription "Field Treatment" topical Chemotherapy for Severe, Chronic Confluent Actinic Changes with Pre-Cancerous Actinic Keratoses Field treatment involves treatment of an entire area of skin that has confluent Actinic Changes (Sun/ Ultraviolet light damage) and PreCancerous Actinic Keratoses by method of PhotoDynamic Therapy (PDT) and/or prescription Topical Chemotherapy agents such as 5-fluorouracil, 5-fluorouracil/calcipotriene, and/or imiquimod.  The purpose is to decrease the number of clinically evident and subclinical PreCancerous lesions to prevent progression to development of skin cancer by chemically  destroying early precancer changes that may or may not be visible.  It has been shown to reduce the risk of developing skin cancer in the treated area. As a result of treatment, redness, scaling, crusting, and open sores may occur during treatment course. One or more than one of these methods may be used and may have to be used several times to control, suppress and eliminate the PreCancerous changes. Discussed treatment course, expected reaction, and possible side effects. - Recommend daily broad spectrum sunscreen SPF 30+ to sun-exposed areas, reapply every 2 hours as needed.  - Staying in the shade or wearing long sleeves, sun glasses (UVA+UVB protection) and wide brim hats (4-inch brim around the entire circumference of the hat) are also recommended. - Call for new or changing lesions. - Discussed PDT red light with debridement this fall/winter to face.   SEBORRHEIC KERATOSIS - Stuck-on, waxy, tan-brown papules and/or plaques, including right back, pt defers cryotherapy - Benign-appearing - Discussed benign etiology and prognosis. - Observe - Call for any changes  Purpura - Chronic; persistent and recurrent.  Treatable, but not curable. - Violaceous macules and patches - Benign - Related to trauma, age, sun damage and/or use of blood thinners, chronic use of topical and/or oral steroids - Observe - Can use OTC arnica containing moisturizer such as Dermend Bruise Formula if desired - Call for worsening or other concerns  LENTIGINES Exam: scattered tan macules Due to sun exposure Treatment Plan: Benign-appearing, observe. Recommend daily broad spectrum sunscreen SPF 30+ to sun-exposed areas, reapply every 2 hours as needed.  Call for any changes.  AK (actinic keratosis) (10) R earlobe x 1, R preauricular x 2, L lower temple x 1, L sideburn  x 1, L preauricular x 1, L earlobe x 1, L malar cheek x 1, L ear helix x 1, R forehead x 1  Actinic keratoses are precancerous spots that appear  secondary to cumulative UV radiation exposure/sun exposure over time. They are chronic with expected duration over 1 year. A portion of actinic keratoses will progress to squamous cell carcinoma of the skin. It is not possible to reliably predict which spots will progress to skin cancer and so treatment is recommended to prevent development of skin cancer.  Recommend daily broad spectrum sunscreen SPF 30+ to sun-exposed areas, reapply every 2 hours as needed.  Recommend staying in the shade or wearing long sleeves, sun glasses (UVA+UVB protection) and wide brim hats (4-inch brim around the entire circumference of the hat). Call for new or changing lesions.  Destruction of lesion - R earlobe x 1, R preauricular x 2, L lower temple x 1, L sideburn x 1, L preauricular x 1, L earlobe x 1, L malar cheek x 1, L ear helix x 1, R forehead x 1  Destruction method: cryotherapy   Informed consent: discussed and consent obtained   Lesion destroyed using liquid nitrogen: Yes   Region frozen until ice ball extended beyond lesion: Yes   Outcome: patient tolerated procedure well with no complications   Post-procedure details: wound care instructions given   Additional details:  Prior to procedure, discussed risks of blister formation, small wound, skin dyspigmentation, or rare scar following cryotherapy. Recommend Vaseline ointment to treated areas while healing.    Return in about 6 months (around 11/05/2023) for UBSE, Hx AKs.  ICherlyn Labella, CMA, am acting as scribe for Willeen Niece, MD .   Documentation: I have reviewed the above documentation for accuracy and completeness, and I agree with the above.  Willeen Niece, MD

## 2023-09-03 ENCOUNTER — Encounter: Payer: Self-pay | Admitting: Dermatology

## 2023-09-03 ENCOUNTER — Ambulatory Visit: Payer: Medicare HMO | Admitting: Dermatology

## 2023-09-03 DIAGNOSIS — L570 Actinic keratosis: Secondary | ICD-10-CM

## 2023-09-03 MED ORDER — AMINOLEVULINIC ACID HCL 10 % EX GEL
2000.0000 mg | Freq: Once | CUTANEOUS | Status: AC
  Administered 2023-09-03: 2000 mg via TOPICAL

## 2023-09-03 NOTE — Patient Instructions (Signed)

## 2023-09-03 NOTE — Progress Notes (Signed)
Patient completed red light phototherapy with debridement today.  ACTINIC KERATOSES Exam: Erythematous thin papules/macules with gritty scale.  Treatment Plan:  Red Light Photodynamic therapy  Procedure discussed: discussed risks, benefits, side effects. and alternatives   Prep: site scrubbed/prepped with acetone   Debridement needed: Yes (performed by Physician with sand paper.  (CPT C5184948)) Location:  face Number of lesions:  Multiple (> 15) Type of treatment:  Red light Aminolevulinic Acid (see MAR for details): Ameluz Aminolevulinic Acid comment:  [id number] Amount of Ameluz (mg):  1 Incubation time (minutes):  60 Number of minutes under lamp:  20 Cooling:  Fan Outcome: patient tolerated procedure well with no complications   Post-procedure details: sunscreen applied and aftercare instructions given to patient    Related Medications Aminolevulinic Acid HCl 10 % GEL 2,000 mg  Edward Petersen  I personally debrided area prior to application of aminolevulinic acid  Documentation: I have reviewed the above documentation for accuracy and completeness, and I agree with the above.  Edward Sans, MD

## 2023-11-10 ENCOUNTER — Ambulatory Visit: Payer: Managed Care, Other (non HMO) | Admitting: Dermatology

## 2023-11-16 ENCOUNTER — Ambulatory Visit: Payer: Medicare HMO | Admitting: Dermatology

## 2023-11-16 ENCOUNTER — Encounter: Payer: Self-pay | Admitting: Dermatology

## 2023-11-16 DIAGNOSIS — D229 Melanocytic nevi, unspecified: Secondary | ICD-10-CM

## 2023-11-16 DIAGNOSIS — W908XXA Exposure to other nonionizing radiation, initial encounter: Secondary | ICD-10-CM

## 2023-11-16 DIAGNOSIS — L578 Other skin changes due to chronic exposure to nonionizing radiation: Secondary | ICD-10-CM

## 2023-11-16 DIAGNOSIS — D039 Melanoma in situ, unspecified: Secondary | ICD-10-CM

## 2023-11-16 DIAGNOSIS — C4491 Basal cell carcinoma of skin, unspecified: Secondary | ICD-10-CM

## 2023-11-16 DIAGNOSIS — L820 Inflamed seborrheic keratosis: Secondary | ICD-10-CM | POA: Diagnosis not present

## 2023-11-16 DIAGNOSIS — L821 Other seborrheic keratosis: Secondary | ICD-10-CM

## 2023-11-16 DIAGNOSIS — D0359 Melanoma in situ of other part of trunk: Secondary | ICD-10-CM | POA: Diagnosis not present

## 2023-11-16 DIAGNOSIS — C44310 Basal cell carcinoma of skin of unspecified parts of face: Secondary | ICD-10-CM

## 2023-11-16 DIAGNOSIS — Z1283 Encounter for screening for malignant neoplasm of skin: Secondary | ICD-10-CM

## 2023-11-16 DIAGNOSIS — L814 Other melanin hyperpigmentation: Secondary | ICD-10-CM

## 2023-11-16 DIAGNOSIS — D492 Neoplasm of unspecified behavior of bone, soft tissue, and skin: Secondary | ICD-10-CM

## 2023-11-16 DIAGNOSIS — C44311 Basal cell carcinoma of skin of nose: Secondary | ICD-10-CM | POA: Diagnosis not present

## 2023-11-16 DIAGNOSIS — D1801 Hemangioma of skin and subcutaneous tissue: Secondary | ICD-10-CM

## 2023-11-16 DIAGNOSIS — Z872 Personal history of diseases of the skin and subcutaneous tissue: Secondary | ICD-10-CM

## 2023-11-16 HISTORY — DX: Basal cell carcinoma of skin, unspecified: C44.91

## 2023-11-16 HISTORY — DX: Melanoma in situ, unspecified: D03.9

## 2023-11-16 NOTE — Patient Instructions (Addendum)

## 2023-11-16 NOTE — Progress Notes (Signed)
Follow-Up Visit   Subjective  Edward Petersen is a 72 y.o. male who presents for the following: Skin Cancer Screening and Upper Body Skin Exam hx of Aks, Red [place] txt 09/03/23  The patient presents for Upper Body Skin Exam (UBSE) for skin cancer screening and mole check. The patient has spots, moles and lesions to be evaluated, some may be new or changing and the patient may have concern these could be cancer.    The following portions of the chart were reviewed this encounter and updated as appropriate: medications, allergies, medical history  Review of Systems:  No other skin or systemic complaints except as noted in HPI or Assessment and Plan.  Objective  Well appearing patient in no apparent distress; mood and affect are within normal limits.  All skin waist up examined. Relevant physical exam findings are noted in the Assessment and Plan.  L lat eye 5.55mm pink pearly flat pap     L spinal mid upper back 6.8mm pink brown speckled macule with adjacent brown macule total size 0.8cm       L nasal alar crease 3.17mm pink pearly macule     R hand dorsum x 2, L hand dorsum 3rd MCP x 1, R lower back x 1 (4) Stuck on waxy paps with erythema    Assessment & Plan   Neoplasm of skin (3) L lat eye  Epidermal / dermal shaving  Lesion diameter (cm):  0.7 Informed consent: discussed and consent obtained   Patient was prepped and draped in usual sterile fashion: area prepped with alcohol. Anesthesia: the lesion was anesthetized in a standard fashion   Anesthetic:  1% lidocaine w/ epinephrine 1-100,000 buffered w/ 8.4% NaHCO3 Instrument used: flexible razor blade   Hemostasis achieved with: pressure, aluminum chloride and electrodesiccation   Outcome: patient tolerated procedure well   Post-procedure details: wound care instructions given   Post-procedure details comment:  Ointment and small bandage applied  Specimen 1 - Surgical pathology Differential Diagnosis:  D48.5 Sebaceous Hyperplasia r/o BCC  Check Margins: yes 5.8mm pink pearly flat pap  L spinal mid upper back  Epidermal / dermal shaving  Lesion diameter (cm):  1.1 Informed consent: discussed and consent obtained   Patient was prepped and draped in usual sterile fashion: area prepped with alcohol. Anesthesia: the lesion was anesthetized in a standard fashion   Anesthetic:  1% lidocaine w/ epinephrine 1-100,000 buffered w/ 8.4% NaHCO3 Instrument used: flexible razor blade   Hemostasis achieved with: pressure, aluminum chloride and electrodesiccation   Outcome: patient tolerated procedure well   Post-procedure details: wound care instructions given   Post-procedure details comment:  Ointment and small bandage applied  Specimen 2 - Surgical pathology Differential Diagnosis: Lentigo r/o Atypia  Check Margins: No 6.82mm pink brown speckled macule with adjacent brown macule total size 0.8cm  L nasal alar crease  Epidermal / dermal shaving  Lesion diameter (cm):  0.4 Informed consent: discussed and consent obtained   Patient was prepped and draped in usual sterile fashion: area prepped with alcohol. Anesthesia: the lesion was anesthetized in a standard fashion   Anesthetic:  1% lidocaine w/ epinephrine 1-100,000 buffered w/ 8.4% NaHCO3 Instrument used: flexible razor blade   Hemostasis achieved with: pressure, aluminum chloride and electrodesiccation   Outcome: patient tolerated procedure well   Post-procedure details: wound care instructions given   Post-procedure details comment:  Ointment and small bandage applied  Specimen 3 - Surgical pathology Differential Diagnosis: D48.5 Sebaceous Hyperplasia R/O BCC  Check Margins: yes 3.2mm pink pearly macule  Inflamed seborrheic keratosis (4) R hand dorsum x 2, L hand dorsum 3rd MCP x 1, R lower back x 1  Symptomatic, irritating, patient would like treated.  Residual, avoid picking  Destruction of lesion - R hand dorsum x 2, L  hand dorsum 3rd MCP x 1, R lower back x 1 (4)  Destruction method: cryotherapy   Informed consent: discussed and consent obtained   Lesion destroyed using liquid nitrogen: Yes   Region frozen until ice ball extended beyond lesion: Yes   Outcome: patient tolerated procedure well with no complications   Post-procedure details: wound care instructions given   Additional details:  Prior to procedure, discussed risks of blister formation, small wound, skin dyspigmentation, or rare scar following cryotherapy. Recommend Vaseline ointment to treated areas while healing.    Skin cancer screening performed today.  Actinic Damage - Chronic condition, secondary to cumulative UV/sun exposure - diffuse scaly erythematous macules with underlying dyspigmentation - Recommend daily broad spectrum sunscreen SPF 30+ to sun-exposed areas, reapply every 2 hours as needed.  - Staying in the shade or wearing long sleeves, sun glasses (UVA+UVB protection) and wide brim hats (4-inch brim around the entire circumference of the hat) are also recommended for sun protection.  - Call for new or changing lesions.  Lentigines, Seborrheic Keratoses, Hemangiomas - Benign normal skin lesions - Benign-appearing - Call for any changes  Melanocytic Nevi - Tan-brown and/or pink-flesh-colored symmetric macules and papules - Benign appearing on exam today - Observation - Call clinic for new or changing moles - Recommend daily use of broad spectrum spf 30+ sunscreen to sun-exposed areas.   HISTORY OF PRECANCEROUS ACTINIC KERATOSIS Good results with Red Light treatment - site(s) of PreCancerous Actinic Keratosis clear today. - these may recur and new lesions may form requiring treatment to prevent transformation into skin cancer - observe for new or changing spots and contact [facility] for appointment if occur - photoprotection with sun protective clothing; sunglasses and broad spectrum sunscreen with SPF of at  least 30 + and frequent self skin exams recommended - yearly exams by a dermatologist recommended for persons with history of PreCancerous Actinic Keratoses    Return in about 6 months (around 05/16/2024) for AK f/u.  I, Ardis Rowan, RMA, am acting as scribe for Willeen Niece, MD .   Documentation: I have reviewed the above documentation for accuracy and completeness, and I agree with the above.  Willeen Niece, MD

## 2023-11-20 LAB — SURGICAL PATHOLOGY

## 2023-11-23 ENCOUNTER — Telehealth: Payer: Self-pay

## 2023-11-23 NOTE — Telephone Encounter (Signed)
Dr. Roseanne Reno discussed pathology with patient.  Scheduled pt for surgery for Melanoma IS 12/23/23 at 1:30.  Advised pt Dr. Roseanne Reno would txt the [facility] on post op appt.

## 2023-11-23 NOTE — Telephone Encounter (Signed)
-----   Message from Willeen Niece sent at 11/23/2023 12:36 PM EST ----- Pt called back, new mobile # is [phone], please update.  Discussed results including melanoma and need for further excision- please schedule.  Discussed Edc vs excision vs Mohs for BCCs on face, pt prefers EDC   - please call patient

## 2023-12-23 ENCOUNTER — Ambulatory Visit: Payer: Medicare HMO | Admitting: Dermatology

## 2023-12-23 ENCOUNTER — Encounter: Payer: Self-pay | Admitting: Dermatology

## 2023-12-23 DIAGNOSIS — D0359 Melanoma in situ of other part of trunk: Secondary | ICD-10-CM | POA: Diagnosis not present

## 2023-12-23 MED ORDER — MUPIROCIN 2 % EX OINT: 1.0000 | TOPICAL_OINTMENT | Freq: Every day | CUTANEOUS | 1 refills | Status: AC

## 2023-12-23 NOTE — Patient Instructions (Signed)

## 2023-12-23 NOTE — Progress Notes (Signed)
   Follow-Up Visit   Subjective  Edward Petersen is a 73 y.o. male who presents for the following: Melanoma IS bx proven L  spinal mid upper back, pt presents for excision.  He also has bx-proven BCC x 2 that he prefers to have EDC to treat which will be treated next week at post-op.    The following portions of the chart were reviewed this encounter and updated as appropriate: medications, allergies, medical history  Review of Systems:  No other skin or systemic complaints except as noted in HPI or Assessment and Plan.  Objective  Well appearing patient in no apparent distress; mood and affect are within normal limits.   A focused examination was performed of the following areas: back  Relevant exam findings are noted in the Assessment and Plan.  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 mid upper back Skin excision  Lesion length (cm):  0.6 Lesion width (cm):  1.1 Margin per side (cm):  0.5 Total excision diameter (cm):  2.1 Informed consent: discussed and consent obtained   Timeout: patient name, date of birth, surgical site, and procedure verified   Procedure prep:  Patient was prepped and draped in usual sterile fashion Prep type:  Povidone-iodine Anesthesia: the lesion was anesthetized in a standard fashion   Anesthetic:  1% lidocaine  w/ epinephrine 1-100,000 buffered w/ 8.4% NaHCO3 (15cc lido w/ Epi, 6cc Bupivicaine, Total 21cc) Instrument used: #15 blade   Hemostasis achieved with: pressure and electrodesiccation   Outcome: patient tolerated procedure well with no complications    Skin repair Complexity:  Complex Final length (cm):  5 Informed consent: discussed and consent obtained   Timeout: patient name, date of birth, surgical site, and  procedure verified   Reason for type of repair: reduce tension to allow closure, reduce the risk of dehiscence, infection, and necrosis, reduce subcutaneous dead space and avoid a hematoma, allow closure of the large defect, preserve normal anatomical and functional relationships and enhance both functionality and cosmetic results   Undermining: area extensively undermined   Undermining comment:  2.1 cm Subcutaneous layers (deep stitches):  Suture size:  2-0 and 3-0 Suture type: Vicryl (polyglactin 910)   Stitches:  Buried vertical mattress Fine/surface layer approximation (top stitches):  Suture size:  3-0 Suture type: nylon   Stitches: simple interrupted   Suture removal (days):  7 Hemostasis achieved with: suture Outcome: patient tolerated procedure well with no complications   Post-procedure details: sterile dressing applied and wound care instructions given   Dressing type: pressure dressing (Mupirocin )    mupirocin  ointment (BACTROBAN ) 2 % Apply 1 Application topically daily. qd to excision site Specimen 1 - Surgical pathology Differential Diagnosis: bx proven Melanoma IS  Check Margins: yes Pink bx site 0.6 x 1.1cm [id number] Tagged at superior 12 o'clock tip Bx proven, excised today Start Mupirocin  oint qd to excision site  Return in about 1 week (around 12/30/2023) for suture removal and EDC x 2.  I, Sonya Hupman, RMA, am acting as scribe for Artemio Larry, MD .   Documentation: I have reviewed the above documentation for accuracy and completeness, and I agree with the above.  Artemio Larry, MD

## 2023-12-24 ENCOUNTER — Telehealth: Payer: Self-pay

## 2023-12-24 LAB — SURGICAL PATHOLOGY

## 2023-12-24 LAB — EXTERNAL GENERIC LAB PROCEDURE: COLOGUARD: POSITIVE — AB

## 2023-12-24 LAB — COLOGUARD: COLOGUARD: POSITIVE — AB

## 2023-12-24 NOTE — Telephone Encounter (Signed)
Patient doing well after yesterdays surgery./sh 

## 2023-12-31 ENCOUNTER — Ambulatory Visit: Payer: Medicare HMO | Admitting: Dermatology

## 2023-12-31 ENCOUNTER — Encounter: Payer: Self-pay | Admitting: Dermatology

## 2023-12-31 DIAGNOSIS — Z86006 Personal history of melanoma in-situ: Secondary | ICD-10-CM

## 2023-12-31 DIAGNOSIS — D0359 Melanoma in situ of other part of trunk: Secondary | ICD-10-CM

## 2023-12-31 DIAGNOSIS — Z48817 Encounter for surgical aftercare following surgery on the skin and subcutaneous tissue: Secondary | ICD-10-CM

## 2023-12-31 DIAGNOSIS — C44319 Basal cell carcinoma of skin of other parts of face: Secondary | ICD-10-CM | POA: Diagnosis not present

## 2023-12-31 NOTE — Progress Notes (Signed)
   Follow-Up Visit   Subjective  Edward Petersen is a 73 y.o. male who presents for the following: EDC for bx proven BCC at left lateral eye and left nasal alar crease. Also here for suture removal at left spinal mid upper back.  The patient has spots, moles and lesions to be evaluated, some may be new or changing and the patient may have concern these could be cancer.   The following portions of the chart were reviewed this encounter and updated as appropriate: medications, allergies, medical history  Review of Systems:  No other skin or systemic complaints except as noted in HPI or Assessment and Plan.  Objective  Well appearing patient in no apparent distress; mood and affect are within normal limits.   A focused examination was performed of the following areas: Face, back  Relevant exam findings are noted in the Assessment and Plan.  Left Lateral Eye Healing bx site Left Nasal Alar Crease Healing bx site  Assessment & Plan   Encounter for Removal of Sutures - Incision site at the left spinal mid upper back is clean, dry and intact - Wound cleansed, sutures removed, wound cleansed and steri strips applied.  - Discussed pathology results showing  NO RESIDUAL MELANOMA IN SITU   - Patient advised to keep steri-strips dry until they fall off. - Scars remodel for a full year. - Once steri-strips fall off, patient can apply over-the-counter silicone scar cream each night to help with scar remodeling if desired. - Patient advised to call with any concerns or if they notice any new or changing lesions.   BASAL CELL CARCINOMA (BCC) OF SKIN OF OTHER PART OF FACE (2) Left Lateral Eye Destruction of lesion  Destruction method: electrodesiccation and curettage   Informed consent: discussed and consent obtained   Timeout:  patient name, date of birth, surgical site, and procedure verified Anesthesia: the lesion was anesthetized in a standard fashion   Anesthetic:  1% lidocaine w/  epinephrine 1-100,000 local infiltration Curettage performed in three different directions: Yes   Electrodesiccation performed over the curetted area: Yes   Final wound size (cm):  0.7 Hemostasis achieved with:  pressure, aluminum chloride and electrodesiccation Outcome: patient tolerated procedure well with no complications   Post-procedure details: wound care instructions given   Additional details:  Mupirocin ointment and Bandaid applied  Left Nasal Alar Crease Destruction of lesion  Destruction method: electrodesiccation and curettage   Informed consent: discussed and consent obtained   Timeout:  patient name, date of birth, surgical site, and procedure verified Anesthesia: the lesion was anesthetized in a standard fashion   Anesthetic:  1% lidocaine w/ epinephrine 1-100,000 local infiltration Curettage performed in three different directions: Yes   Electrodesiccation performed over the curetted area: Yes   Final wound size (cm):  0.6 Hemostasis achieved with:  pressure, aluminum chloride and electrodesiccation Outcome: patient tolerated procedure well with no complications   Post-procedure details: wound care instructions given   Additional details:  Mupirocin ointment and Bandaid applied   Return for TBSE, as scheduled, with Dr. Roseanne Reno, Hx MMis, Hx BCC.  [name], RMA, am acting as scribe for Willeen Niece, MD .   Documentation: I have reviewed the above documentation for accuracy and completeness, and I agree with the above.  Willeen Niece, MD

## 2023-12-31 NOTE — Patient Instructions (Signed)

## 2024-03-24 ENCOUNTER — Encounter: Payer: Self-pay | Admitting: Gastroenterology

## 2024-04-06 ENCOUNTER — Encounter: Payer: Self-pay | Admitting: Gastroenterology

## 2024-04-07 ENCOUNTER — Encounter: Payer: Self-pay | Admitting: Gastroenterology

## 2024-04-07 ENCOUNTER — Encounter
Admission: RE | Disposition: A | Discharge status: Home / Self Care | Payer: Self-pay | Source: Home / Self Care | Attending: Gastroenterology

## 2024-04-07 ENCOUNTER — Ambulatory Visit
Admission: RE | Admit: 2024-04-07 | Discharge: 2024-04-07 | Disposition: A | Discharge status: Home / Self Care | Attending: Gastroenterology | Admitting: Gastroenterology

## 2024-04-07 ENCOUNTER — Ambulatory Visit: Admitting: Anesthesiology

## 2024-04-07 ENCOUNTER — Other Ambulatory Visit: Payer: Self-pay

## 2024-04-07 DIAGNOSIS — D122 Benign neoplasm of ascending colon: Secondary | ICD-10-CM | POA: Diagnosis not present

## 2024-04-07 DIAGNOSIS — D125 Benign neoplasm of sigmoid colon: Secondary | ICD-10-CM | POA: Diagnosis not present

## 2024-04-07 DIAGNOSIS — K5730 Diverticulosis of large intestine without perforation or abscess without bleeding: Secondary | ICD-10-CM | POA: Diagnosis not present

## 2024-04-07 DIAGNOSIS — D123 Benign neoplasm of transverse colon: Secondary | ICD-10-CM | POA: Insufficient documentation

## 2024-04-07 DIAGNOSIS — Z1211 Encounter for screening for malignant neoplasm of colon: Secondary | ICD-10-CM | POA: Insufficient documentation

## 2024-04-07 DIAGNOSIS — K641 Second degree hemorrhoids: Secondary | ICD-10-CM | POA: Insufficient documentation

## 2024-04-07 DIAGNOSIS — K625 Hemorrhage of anus and rectum: Secondary | ICD-10-CM | POA: Diagnosis not present

## 2024-04-07 DIAGNOSIS — R195 Other fecal abnormalities: Secondary | ICD-10-CM | POA: Insufficient documentation

## 2024-04-07 HISTORY — PX: COLONOSCOPY: SHX5424

## 2024-04-07 HISTORY — PX: POLYPECTOMY: SHX149

## 2024-04-07 SURGERY — COLONOSCOPY
Anesthesia: General

## 2024-04-07 MED ORDER — LIDOCAINE HCL (PF) 2 % IJ SOLN
INTRAMUSCULAR | Status: AC
  Filled 2024-04-07: qty 5

## 2024-04-07 MED ORDER — SODIUM CHLORIDE 0.9 % IV SOLN: INTRAVENOUS | Status: DC

## 2024-04-07 MED ORDER — PROPOFOL 10 MG/ML IV BOLUS
INTRAVENOUS | Status: DC | PRN
  Administered 2024-04-07: 70 mg via INTRAVENOUS

## 2024-04-07 MED ORDER — PROPOFOL 500 MG/50ML IV EMUL
INTRAVENOUS | Status: DC | PRN
  Administered 2024-04-07: 125 ug/kg/min via INTRAVENOUS

## 2024-04-07 MED ORDER — LIDOCAINE HCL (CARDIAC) PF 100 MG/5ML IV SOSY
PREFILLED_SYRINGE | INTRAVENOUS | Status: DC | PRN
  Administered 2024-04-07: 80 mg via INTRAVENOUS

## 2024-04-07 MED ORDER — PROPOFOL 1000 MG/100ML IV EMUL
INTRAVENOUS | Status: AC
  Filled 2024-04-07: qty 100

## 2024-04-07 NOTE — Interval H&P Note (Signed)
 History and Physical Interval Note: Preprocedure H&P from 04/07/24  was reviewed and there was no interval change after seeing and examining the patient.  Written consent was obtained from the patient after discussion of risks, benefits, and alternatives. Patient has consented to proceed with Colonoscopy with possible intervention   04/07/2024 8:05 AM  Edward Petersen  has presented today for surgery, with the diagnosis of Personal history of adenomatous and serrated colon polyps (Z86.0101) Positive colorectal cancer screening using Cologuard test (R19.5).  The various methods of treatment have been discussed with the patient and family. After consideration of risks, benefits and other options for treatment, the patient has consented to  Procedure(s): COLONOSCOPY (N/A) as a surgical intervention.  The patient's history has been reviewed, patient examined, no change in status, stable for surgery.  I have reviewed the patient's chart and labs.  Questions were answered to the patient's satisfaction.     [name]

## 2024-04-07 NOTE — H&P (Signed)
 Pre-Procedure H&P   Patient ID: Edward Petersen is a 73 y.o. male.  Gastroenterology Provider: [name], DO  Referring Provider: Dr. Norberto Bear PCP: Nestor Banter, MD  Date: 04/07/2024  HPI Mr. Edward Petersen is a 73 y.o. male who presents today for Colonoscopy for Positive Cologuard .  Patient had positive Cologuard in January 2025.  Has a personal history of polyps.  Last underwent colonoscopy in 2016 which was reportedly normal.  2008 2 tubular adenomas.  No family history of colon cancer or colon polyps.  The patient reports 2 bowel movements per day without melena or hematochezia. Occ brbpr with straining. Pt feels ext hemorrhoid  Hemoglobin 15.3 MCV 92 platelets 189,000   Past Medical History:  Diagnosis Date   Basal cell carcinoma 11/16/2023   L lat eye, EDC 12/31/23   Basal cell carcinoma 11/16/2023   L nasal alar crease, EDC 12/31/23   Bell's palsy 2000   resolved except tinnitus in right ear.   Chickenpox    Complication of anesthesia    Gout    History of kidney stones    Melanoma in situ (HCC) 11/16/2023   L spinal mid upper back, WLE 12/23/23   Osteoarthritis    both wrists   PONV (postoperative nausea and vomiting) 2000   shoulder surgery   Traumatic injury of head     Past Surgical History:  Procedure Laterality Date   COLONOSCOPY     COLONOSCOPY WITH PROPOFOL  N/A 11/30/2015   Procedure: COLONOSCOPY WITH PROPOFOL ;  Surgeon: Stephens Eis, MD;  Location: [facility];  Service: Gastroenterology;  Laterality: N/A;   CYSTOSCOPY W/ RETROGRADES Bilateral 12/09/2016   Procedure: CYSTOSCOPY WITH RETROGRADE PYELOGRAM;  Surgeon: Osborn Blaze, MD;  Location: [facility] ORS;  Service: Urology;  Laterality: Bilateral;   CYSTOSCOPY W/ URETERAL STENT PLACEMENT Bilateral 12/29/2016   Procedure: CYSTOSCOPY WITH STENT REPLACEMENT;  Surgeon: Dustin Gimenez, MD;  Location: [facility] ORS;  Service: Urology;  Laterality: Bilateral;   CYSTOSCOPY W/ URETERAL STENT PLACEMENT  Bilateral 01/13/2017   Procedure: CYSTOSCOPY WITH STENT REPLACEMENT;  Surgeon: Dustin Gimenez, MD;  Location: [facility] OR[facility];  Service: Urology;  Laterality: Bilateral;   CYSTOSCOPY WITH STENT PLACEMENT Bilateral 12/09/2016   Procedure: CYSTOSCOPY WITH STENT PLACEMENT;  Surgeon: Osborn Blaze, MD;  Location: [facility] OR[facility];  Service: Urology;  Laterality: Bilateral;   left shoulder rotator cuff repair     LITHOTRIPSY     topaz of right elbow     URETEROSCOPY WITH HOLMIUM LASER LITHOTRIPSY Bilateral 12/29/2016   Procedure: URETEROSCOPY WITH HOLMIUM LASER LITHOTRIPSY;  Surgeon: Dustin Gimenez, MD;  Location: [facility];  Service: Urology;  Laterality: Bilateral;   URETEROSCOPY WITH HOLMIUM LASER LITHOTRIPSY Bilateral 01/13/2017   Procedure: URETEROSCOPY WITH HOLMIUM LASER LITHOTRIPSY;  Surgeon: Dustin Gimenez, MD;  Location: [facility] OR[facility];  Service: Urology;  Laterality: Bilateral;    Family History Brother- pancreatic ca No other h/o GI disease or malignancy  Review of Systems  Constitutional:  Negative for activity change, appetite change, chills, diaphoresis, fatigue, fever and unexpected weight change.  HENT:  Negative for trouble swallowing and voice change.   Respiratory:  Negative for shortness of breath and wheezing.   Cardiovascular:  Negative for chest pain, palpitations and leg swelling.  Gastrointestinal:  Positive for anal bleeding. Negative for abdominal distention, abdominal pain, blood in stool, constipation, diarrhea, nausea and vomiting.  Musculoskeletal:  Negative for arthralgias and myalgias.  Skin:  Negative for color change and pallor.  Neurological:  Negative for dizziness, syncope  and weakness.  Psychiatric/Behavioral:  Negative for confusion. The patient is not nervous/anxious.   All other systems reviewed and are negative.    Medications No current facility-administered medications on file prior to encounter.   Current Outpatient Medications on File Prior to Encounter  Medication Sig  Dispense Refill   acetaminophen  (TYLENOL ) 500 MG tablet Take 500 mg by mouth every 6 (six) hours as needed.     Cholecalciferol (VITAMIN D PO) Take 1 tablet by mouth daily.     ibuprofen (ADVIL,MOTRIN) 200 MG tablet Take 200 mg by mouth every 6 (six) hours as needed.     Multiple Vitamin (MULTIVITAMIN WITH MINERALS) TABS tablet Take 1 tablet by mouth daily.     omega-3 acid ethyl esters (LOVAZA ) 1 G capsule Take 1 g by mouth daily.      Carbamide Peroxide ([employer] EAR DROPS OT) Place 4 drops into the right eye 4 (four) times daily. As needed     mupirocin  ointment (BACTROBAN ) 2 % Apply 1 Application topically daily. qd to excision site 22 g 1    Pertinent medications related to GI and procedure were reviewed by me with the patient prior to the procedure   Current Facility-Administered Medications:    0.9 %  sodium chloride  infusion, , Intravenous, Continuous, [name], DO, Last Rate: 20 mL/hr at 04/07/24 0756, New Bag at 04/07/24 0756  sodium chloride  20 mL/hr at 04/07/24 1610       Allergies  Allergen Reactions   Primidone Nausea Only and Other (See Comments)    Suicidal thoughts   Allergies were reviewed by me prior to the procedure  Objective   Body mass index is 26.73 kg/m. Vitals:   04/07/24 0749  BP: 127/89  Pulse: 63  Resp: 16  Temp: (!) 97.4 F (36.3 C)  TempSrc: Temporal  SpO2: 98%  Weight: 82.1 kg  Height: 5\' 9"  (1.753 m)     Physical Exam Vitals and nursing note reviewed.  Constitutional:      General: He is not in acute distress.    Appearance: Normal appearance. He is not ill-appearing, toxic-appearing or diaphoretic.  HENT:     Head: Normocephalic and atraumatic.     Nose: Nose normal.     Mouth/Throat:     Mouth: Mucous membranes are moist.     Pharynx: Oropharynx is clear.  Eyes:     General: No scleral icterus.    Extraocular Movements: Extraocular movements intact.  Cardiovascular:     Rate and Rhythm: Normal rate and regular  rhythm.     Heart sounds: Normal heart sounds. No murmur heard.    No friction rub. No gallop.  Pulmonary:     Effort: Pulmonary effort is normal. No respiratory distress.     Breath sounds: Normal breath sounds. No wheezing, rhonchi or rales.  Abdominal:     General: Bowel sounds are normal. There is no distension.     Palpations: Abdomen is soft.     Tenderness: There is no abdominal tenderness. There is no guarding or rebound.  Musculoskeletal:     Cervical back: Neck supple.     Right lower leg: No edema.     Left lower leg: No edema.  Skin:    General: Skin is warm and dry.     Coloration: Skin is not jaundiced or pale.  Neurological:     General: No focal deficit present.     Mental Status: He is alert and oriented to person, place, and  time. Mental status is at baseline.  Psychiatric:        Mood and Affect: Mood normal.        Behavior: Behavior normal.        Thought Content: Thought content normal.        Judgment: Judgment normal.      Assessment:  Mr. ELIZANDRO MACISAAC is a 73 y.o. male  who presents today for Colonoscopy for Positive Cologuard .  Plan:  Colonoscopy with possible intervention today  Colonoscopy with possible biopsy, control of bleeding, polypectomy, and interventions as necessary has been discussed with the patient/patient representative. Informed consent was obtained from the patient/patient representative after explaining the indication, nature, and risks of the procedure including but not limited to death, bleeding, perforation, missed neoplasm/lesions, cardiorespiratory compromise, and reaction to medications. Opportunity for questions was given and appropriate answers were provided. Patient/patient representative has verbalized understanding is amenable to undergoing the procedure.   [name], DO  [facility]  Portions of the record may have been created with voice recognition software. Occasional wrong-word or  'sound-a-like' substitutions may have occurred due to the inherent limitations of voice recognition software.  Read the chart carefully and recognize, using context, where substitutions may have occurred.

## 2024-04-07 NOTE — Anesthesia Postprocedure Evaluation (Signed)
 Anesthesia Post Note  Patient: Edward Petersen  Procedure(s) Performed: COLONOSCOPY POLYPECTOMY, INTESTINE  Patient location during evaluation: Endoscopy Anesthesia Type: General Level of consciousness: awake and alert Pain management: pain level controlled Vital Signs Assessment: post-procedure vital signs reviewed and stable Respiratory status: spontaneous breathing, nonlabored ventilation, respiratory function stable and patient connected to nasal cannula oxygen Cardiovascular status: blood pressure returned to baseline and stable Postop Assessment: no apparent nausea or vomiting Anesthetic complications: no   No notable events documented.   Last Vitals:  Vitals:   04/07/24 0839 04/07/24 0841  BP: 102/73 102/73  Pulse:  (!) 58  Resp:  15  Temp: (!) 36.1 C   SpO2:  97%    Last Pain:  Vitals:   04/07/24 0849  TempSrc:   PainSc: 0-No pain                 Portia Brittle Myrissa Chipley

## 2024-04-07 NOTE — Anesthesia Preprocedure Evaluation (Signed)
 Anesthesia Evaluation  Patient identified by MRN, date of birth, ID band Patient awake    Reviewed: Allergy & Precautions, NPO status , Patient's Chart, lab work & pertinent test results  History of Anesthesia Complications (+) PONV and history of anesthetic complications  Airway Mallampati: III  TM Distance: <3 FB Neck ROM: full    Dental  (+) Chipped   Pulmonary neg pulmonary ROS, neg shortness of breath   Pulmonary exam normal        Cardiovascular Exercise Tolerance: Good (-) angina negative cardio ROS Normal cardiovascular exam     Neuro/Psych  Neuromuscular disease  negative psych ROS   GI/Hepatic negative GI ROS, Neg liver ROS,,,  Endo/Other  negative endocrine ROS    Renal/GU Renal disease  negative genitourinary   Musculoskeletal   Abdominal   Peds  Hematology negative hematology ROS (+)   Anesthesia Other Findings Past Medical History: 11/16/2023: Basal cell carcinoma     Comment:  L lat eye, [facility] 12/31/23 11/16/2023: Basal cell carcinoma     Comment:  L nasal alar crease, [facility] 12/31/23 2000: Bell's palsy     Comment:  resolved except tinnitus in right ear. No date: Chickenpox No date: Complication of anesthesia No date: Gout No date: History of kidney stones 11/16/2023: Melanoma in situ [facility])     Comment:  L spinal mid upper back, [facility] 12/23/23 No date: Osteoarthritis     Comment:  both wrists 2000: PONV (postoperative nausea and vomiting)     Comment:  shoulder surgery No date: Traumatic injury of head  Past Surgical History: No date: COLONOSCOPY 11/30/2015: COLONOSCOPY WITH PROPOFOL ; N/A     Comment:  Procedure: COLONOSCOPY WITH PROPOFOL ;  Surgeon: Stephens Eis, MD;  Location: [facility] ENDOSCOPY;  Service:               Gastroenterology;  Laterality: N/A; 12/09/2016: CYSTOSCOPY W/ RETROGRADES; Bilateral     Comment:  Procedure: CYSTOSCOPY WITH RETROGRADE PYELOGRAM;                 Surgeon: Osborn Blaze, MD;  Location: [facility] ORS;                Service: Urology;  Laterality: Bilateral; 12/29/2016: CYSTOSCOPY W/ URETERAL STENT PLACEMENT; Bilateral     Comment:  Procedure: CYSTOSCOPY WITH STENT REPLACEMENT;  Surgeon:               Dustin Gimenez, MD;  Location: [facility] ORS;  Service:               Urology;  Laterality: Bilateral; 01/13/2017: CYSTOSCOPY W/ URETERAL STENT PLACEMENT; Bilateral     Comment:  Procedure: CYSTOSCOPY WITH STENT REPLACEMENT;  Surgeon:               Dustin Gimenez, MD;  Location: [facility] ORS;  Service:               Urology;  Laterality: Bilateral; 12/09/2016: CYSTOSCOPY WITH STENT PLACEMENT; Bilateral     Comment:  Procedure: CYSTOSCOPY WITH STENT PLACEMENT;  Surgeon:               Osborn Blaze, MD;  Location: [facility] ORS;  Service:               Urology;  Laterality: Bilateral; No date: left shoulder rotator cuff repair No date: LITHOTRIPSY No date: topaz of right elbow 12/29/2016: URETEROSCOPY WITH HOLMIUM LASER LITHOTRIPSY; Bilateral  Comment:  Procedure: URETEROSCOPY WITH HOLMIUM LASER LITHOTRIPSY;               Surgeon: Dustin Gimenez, MD;  Location: [facility] ORS;                Service: Urology;  Laterality: Bilateral; 01/13/2017: URETEROSCOPY WITH HOLMIUM LASER LITHOTRIPSY; Bilateral     Comment:  Procedure: URETEROSCOPY WITH HOLMIUM LASER LITHOTRIPSY;               Surgeon: Dustin Gimenez, MD;  Location: [facility] ORS;                Service: Urology;  Laterality: Bilateral;  BMI    Body Mass Index: 26.73 kg/m      Reproductive/Obstetrics negative OB ROS                             Anesthesia Physical Anesthesia Plan  ASA: 2  Anesthesia Plan: General   Post-op Pain Management:    Induction: Intravenous  PONV Risk Score and Plan: Propofol  infusion and TIVA  Airway Management Planned: Natural Airway and Nasal Cannula  Additional Equipment:   Intra-op Plan:   Post-operative Plan:   Informed Consent: I have  reviewed the patients History and Physical, chart, labs and discussed the procedure including the risks, benefits and alternatives for the proposed anesthesia with the patient or authorized representative who has indicated his/her understanding and acceptance.     Dental Advisory Given  Plan Discussed with: Anesthesiologist, CRNA and Surgeon  Anesthesia Plan Comments: (Patient consented for risks of anesthesia including but not limited to:  - adverse reactions to medications - risk of airway placement if required - damage to eyes, teeth, lips or other oral mucosa - nerve damage due to positioning  - sore throat or hoarseness - Damage to heart, brain, nerves, lungs, other parts of body or loss of life  Patient voiced understanding and assent.)       Anesthesia Quick Evaluation

## 2024-04-07 NOTE — Transfer of Care (Signed)
 Immediate Anesthesia Transfer of Care Note  Patient: Edward Petersen  Procedure(s) Performed: COLONOSCOPY POLYPECTOMY, INTESTINE  Patient Location: PACU and Endoscopy Unit  Anesthesia Type:General  Level of Consciousness: drowsy  Airway & Oxygen Therapy: Patient Spontanous Breathing  Post-op Assessment: Report given to RN and Post -op Vital signs reviewed and stable  Post vital signs: Reviewed and stable  Last Vitals:  Vitals Value Taken Time  BP 102/73 04/07/24 0841  Temp 36.1 C 04/07/24 0839  Pulse 58 04/07/24 0841  Resp 15 04/07/24 0841  SpO2 97 % 04/07/24 0841    Last Pain:  Vitals:   04/07/24 0839  TempSrc: Temporal  PainSc: 0-No pain         Complications: No notable events documented.

## 2024-04-07 NOTE — Op Note (Signed)
 [facility] Gastroenterology Patient Name: Edward Petersen Procedure Date: 04/07/2024 7:14 AM MRN: [id number] Account #: [id number] Date of Birth: 06/16/1951 Admit Type: Outpatient Age: 73 Room: [facility] ENDO ROOM 1 Gender: Male Note Status: Supervisor Override Instrument Name: Colonoscope [id number] Procedure:             Colonoscopy Indications:           Follow-up for history of colon polyps, Positive                         Cologuard test Providers:             [name] DO, DO Referring MD:          Rhesa Celeste MD (Referring MD) Medicines:             Monitored Anesthesia Care Complications:         No immediate complications. Estimated blood loss:                         Minimal. Procedure:             Pre-Anesthesia Assessment:                        - Prior to the procedure, a History and Physical was                         performed, and patient medications and allergies were                         reviewed. The patient is competent. The risks and                         benefits of the procedure and the sedation options and                         risks were discussed with the patient. All questions                         were answered and informed consent was obtained.                         Patient identification and proposed procedure were                         verified by the physician, the nurse, the anesthetist                         and the technician in the endoscopy suite. Mental                         Status Examination: alert and oriented. Airway                         Examination: normal oropharyngeal airway and neck                         mobility. Respiratory Examination: clear to  auscultation. CV Examination: RRR, no murmurs, no S3                         or S4. Prophylactic Antibiotics: The patient does not                         require prophylactic antibiotics. Prior                          Anticoagulants: The patient has taken no anticoagulant                         or antiplatelet agents. ASA Grade Assessment: II - A                         patient with mild systemic disease. After reviewing                         the risks and benefits, the patient was deemed in                         satisfactory condition to undergo the procedure. The                         anesthesia plan was to use monitored anesthesia care                         (MAC). Immediately prior to administration of                         medications, the patient was re-assessed for adequacy                         to receive sedatives. The heart rate, respiratory                         rate, oxygen saturations, blood pressure, adequacy of                         pulmonary ventilation, and response to care were                         monitored throughout the procedure. The physical                         status of the patient was re-assessed after the                         procedure.                        After obtaining informed consent, the colonoscope was                         passed under direct vision. Throughout the procedure,                         the patient's blood pressure, pulse, and oxygen  saturations were monitored continuously. The                         Colonoscope was introduced through the anus and                         advanced to the the cecum, identified by appendiceal                         orifice and ileocecal valve. The colonoscopy was                         performed without difficulty. The patient tolerated                         the procedure well. The quality of the bowel                         preparation was evaluated using the BBPS [facility] Bowel                         Preparation Scale) with scores of: Right Colon = 2                         (minor amount of residual staining, small fragments of                         stool and/or  opaque liquid, but mucosa seen well),                         Transverse Colon = 3 (entire mucosa seen well with no                         residual staining, small fragments of stool or opaque                         liquid) and Left Colon = 2 (minor amount of residual                         staining, small fragments of stool and/or opaque                         liquid, but mucosa seen well). The total BBPS score                         equals 7. The quality of the bowel preparation was                         good. The ileocecal valve, appendiceal orifice, and                         rectum were photographed. Findings:      The perianal and digital rectal examinations were normal. Pertinent       negatives include normal sphincter tone.      Multiple small-mouthed diverticula were found in the sigmoid colon.       Estimated blood loss: none.  Non-bleeding internal hemorrhoids were found during retroflexion. The       hemorrhoids were Grade II (internal hemorrhoids that prolapse but reduce       spontaneously). Estimated blood loss: none.      Three sessile polyps were found in the transverse colon (1) and       ascending colon (2). The polyps were 5 to 8 mm in size. These polyps       were removed with a cold snare. Resection and retrieval were complete.       Estimated blood loss was minimal.      The exam was otherwise without abnormality on direct and retroflexion       views. Impression:            - Diverticulosis in the sigmoid colon.                        - Non-bleeding internal hemorrhoids.                        - Three 5 to 8 mm polyps in the transverse colon and                         in the ascending colon, removed with a cold snare.                         Resected and retrieved.                        - The examination was otherwise normal on direct and                         retroflexion views. Recommendation:        - Patient has a contact number available for                          emergencies. The signs and symptoms of potential                         delayed complications were discussed with the patient.                         Return to normal activities tomorrow. Written                         discharge instructions were provided to the patient.                        - Discharge patient to home.                        - Resume previous diet.                        - Continue present medications.                        - Await pathology results.                        - Repeat colonoscopy for surveillance based on  pathology results.                        - No ibuprofen, naproxen, or other non-steroidal                         anti-inflammatory drugs for 5 days after polyp removal.                        - The findings and recommendations were discussed with                         the patient. Procedure Code(s):     --- Professional ---                        [id number], Colonoscopy, flexible; with removal of                         tumor(s), polyp(s), or other lesion(s) by snare                         technique Diagnosis Code(s):     --- Professional ---                        K64.1, Second degree hemorrhoids                        D12.3, Benign neoplasm of transverse colon (hepatic                         flexure or splenic flexure)                        D12.2, Benign neoplasm of ascending colon                        R19.5, Other fecal abnormalities                        K57.30, Diverticulosis of large intestine without                         perforation or abscess without bleeding CPT copyright 2022 American Medical Association. All rights reserved. The codes documented in this report are preliminary and upon coder review may  be revised to meet current compliance requirements. Attending Participation:      I personally performed the entire procedure. Polo Brisk, DO [name] DO,  DO 04/07/2024 8:39:20 AM This report has been signed electronically. Number of Addenda: 0 Note Initiated On: 04/07/2024 7:14 AM Scope Withdrawal Time: 0 hours 12 minutes 43 seconds  Total Procedure Duration: 0 hours 21 minutes 30 seconds  Estimated Blood Loss:  Estimated blood loss was minimal.      [facility]

## 2024-04-08 LAB — SURGICAL PATHOLOGY

## 2024-05-16 ENCOUNTER — Ambulatory Visit: Payer: Medicare HMO | Admitting: Dermatology

## 2024-05-16 DIAGNOSIS — Z1283 Encounter for screening for malignant neoplasm of skin: Secondary | ICD-10-CM

## 2024-05-16 DIAGNOSIS — L570 Actinic keratosis: Secondary | ICD-10-CM

## 2024-05-16 DIAGNOSIS — L814 Other melanin hyperpigmentation: Secondary | ICD-10-CM

## 2024-05-16 DIAGNOSIS — L578 Other skin changes due to chronic exposure to nonionizing radiation: Secondary | ICD-10-CM

## 2024-05-16 DIAGNOSIS — D225 Melanocytic nevi of trunk: Secondary | ICD-10-CM | POA: Diagnosis not present

## 2024-05-16 DIAGNOSIS — Z7189 Other specified counseling: Secondary | ICD-10-CM

## 2024-05-16 DIAGNOSIS — D2272 Melanocytic nevi of left lower limb, including hip: Secondary | ICD-10-CM

## 2024-05-16 DIAGNOSIS — D1801 Hemangioma of skin and subcutaneous tissue: Secondary | ICD-10-CM

## 2024-05-16 DIAGNOSIS — D229 Melanocytic nevi, unspecified: Secondary | ICD-10-CM

## 2024-05-16 DIAGNOSIS — Z86006 Personal history of melanoma in-situ: Secondary | ICD-10-CM

## 2024-05-16 DIAGNOSIS — W908XXA Exposure to other nonionizing radiation, initial encounter: Secondary | ICD-10-CM

## 2024-05-16 DIAGNOSIS — Z85828 Personal history of other malignant neoplasm of skin: Secondary | ICD-10-CM

## 2024-05-16 DIAGNOSIS — D492 Neoplasm of unspecified behavior of bone, soft tissue, and skin: Secondary | ICD-10-CM | POA: Diagnosis not present

## 2024-05-16 DIAGNOSIS — L821 Other seborrheic keratosis: Secondary | ICD-10-CM

## 2024-05-16 DIAGNOSIS — D0362 Melanoma in situ of left upper limb, including shoulder: Secondary | ICD-10-CM

## 2024-05-16 NOTE — Patient Instructions (Addendum)

## 2024-05-16 NOTE — Progress Notes (Signed)
 Follow-Up Visit   Subjective  Edward Petersen is a 73 y.o. male who presents for the following: Skin Cancer Screening and Full Body Skin Exam hx of Melanoma IS, Aks, BCCs, check spot L lower eyelid  The patient presents for Total-Body Skin Exam (TBSE) for skin cancer screening and mole check. The patient has spots, moles and lesions to be evaluated, some may be new or changing and the patient may have concern these could be cancer.    The following portions of the chart were reviewed this encounter and updated as appropriate: medications, allergies, medical history  Review of Systems:  No other skin or systemic complaints except as noted in HPI or Assessment and Plan.  Objective  Well appearing patient in no apparent distress; mood and affect are within normal limits.  A full examination was performed including scalp, head, eyes, ears, nose, lips, neck, chest, axillae, abdomen, back, buttocks, bilateral upper extremities, bilateral lower extremities, hands, feet, fingers, toes, fingernails, and toenails. All findings within normal limits unless otherwise noted below.   Relevant physical exam findings are noted in the Assessment and Plan.  L frontal hairline x 2, nasal root x 1, L malar cheek x 1, L lat cheek x 4, R frontal scalp hairline x 1, R temple hairline x 1, R preauricular x 2, L forearm x 2 (14) Pink scaly macules L post shoulder 7.39mm pink pearly pap with adjacent brown macule  L inf clavicle 3.46mm med dark brown macule  L lat upper thigh 4.57mm dark brown thin papule   Assessment & Plan   SKIN CANCER SCREENING PERFORMED TODAY.  LENTIGINES, SEBORRHEIC KERATOSES, HEMANGIOMAS - Benign normal skin lesions - Benign-appearing - Call for any changes -SK L infraocular, discussed cosmetic LN2 $60, discussed Hydroquinone  MELANOCYTIC NEVI - Tan-brown and/or pink-flesh-colored symmetric macules and papules - R spinal lower back 3.72mm med dark brown macule with notch -  Benign appearing on exam today - Observation - Call clinic for new or changing moles - Recommend daily use of broad spectrum spf 30+ sunscreen to sun-exposed areas.    HISTORY OF MELANOMA IN SITU - No evidence of recurrence today-L spinal mid upper back, WLE 12/23/2023 - Recommend regular full body skin exams - Recommend daily broad spectrum sunscreen SPF 30+ to sun-exposed areas, reapply every 2 hours as needed.  - Call if any new or changing lesions are noted between office visits    HISTORY OF BASAL CELL CARCINOMA OF THE SKIN - No evidence of recurrence today- L lat eye, L nasal alar crease - Recommend regular full body skin exams - Recommend daily broad spectrum sunscreen SPF 30+ to sun-exposed areas, reapply every 2 hours as needed.  - Call if any new or changing lesions are noted between office visits   AK (ACTINIC KERATOSIS) (14) L frontal hairline x 2, nasal root x 1, L malar cheek x 1, L lat cheek x 4, R frontal scalp hairline x 1, R temple hairline x 1, R preauricular x 2, L forearm x 2 (14) Actinic keratoses are precancerous spots that appear secondary to cumulative UV radiation exposure/sun exposure over time. They are chronic with expected duration over 1 year. A portion of actinic keratoses will progress to squamous cell carcinoma of the skin. It is not possible to reliably predict which spots will progress to skin cancer and so treatment is recommended to prevent development of skin cancer.  Recommend daily broad spectrum sunscreen SPF 30+ to sun-exposed areas, reapply every 2  hours as needed.  Recommend staying in the shade or wearing long sleeves, sun glasses (UVA+UVB protection) and wide brim hats (4-inch brim around the entire circumference of the hat). Call for new or changing lesions. Destruction of lesion - L frontal hairline x 2, nasal root x 1, L malar cheek x 1, L lat cheek x 4, R frontal scalp hairline x 1, R temple hairline x 1, R preauricular x 2, L forearm x 2  (14)  Destruction method: cryotherapy   Informed consent: discussed and consent obtained   Lesion destroyed using liquid nitrogen: Yes   Region frozen until ice ball extended beyond lesion: Yes   Outcome: patient tolerated procedure well with no complications   Post-procedure details: wound care instructions given   Additional details:  Prior to procedure, discussed risks of blister formation, small wound, skin dyspigmentation, or rare scar following cryotherapy. Recommend Vaseline ointment to treated areas while healing.  NEOPLASM OF SKIN (3) L post shoulder Epidermal / dermal shaving  Lesion diameter (cm):  0.7 Informed consent: discussed and consent obtained   Patient was prepped and draped in usual sterile fashion: area prepped with alcohol. Anesthesia: the lesion was anesthetized in a standard fashion   Anesthetic:  1% lidocaine  w/ epinephrine 1-100,000 buffered w/ 8.4% NaHCO3 Instrument used: flexible razor blade   Hemostasis achieved with: pressure, aluminum chloride and electrodesiccation   Outcome: patient tolerated procedure well   Post-procedure details: wound care instructions given   Post-procedure details comment:  Ointment and small bandage applied Specimen 1 - Surgical pathology Differential Diagnosis: R/O BCC vs Atypical Lentigo/Nevus  Check Margins: yes 7.58mm pink pearly pap with adjacent brown macule L inf clavicle Epidermal / dermal shaving  Lesion diameter (cm):  0.4 Informed consent: discussed and consent obtained   Patient was prepped and draped in usual sterile fashion: area prepped with alcohol. Anesthesia: the lesion was anesthetized in a standard fashion   Anesthetic:  1% lidocaine  w/ epinephrine 1-100,000 buffered w/ 8.4% NaHCO3 Instrument used: flexible razor blade   Hemostasis achieved with: pressure, aluminum chloride and electrodesiccation   Outcome: patient tolerated procedure well   Post-procedure details: wound care instructions given    Post-procedure details comment:  Ointment and small bandage applied Specimen 2 - Surgical pathology Differential Diagnosis: Nevus r/o Dysplasia  Check Margins: yes 3.69mm med dark brown macule L lat upper thigh Epidermal / dermal shaving  Lesion diameter (cm):  0.6 Informed consent: discussed and consent obtained   Patient was prepped and draped in usual sterile fashion: area prepped with alcohol. Anesthesia: the lesion was anesthetized in a standard fashion   Anesthetic:  1% lidocaine  w/ epinephrine 1-100,000 buffered w/ 8.4% NaHCO3 Instrument used: flexible razor blade   Hemostasis achieved with: pressure, aluminum chloride and electrodesiccation   Outcome: patient tolerated procedure well   Post-procedure details: wound care instructions given   Post-procedure details comment:  Ointment and small bandage applied Specimen 3 - Surgical pathology Differential Diagnosis: Nevus vs Dysplastic Nevus  Check Margins: yes 4.73mm dark brown thin pap  ACTINIC DAMAGE WITH PRECANCEROUS ACTINIC KERATOSES Counseling for Topical Chemotherapy Management: Patient exhibits: - Severe, confluent actinic changes with pre-cancerous actinic keratoses that is secondary to cumulative UV radiation exposure over time - Condition that is severe; chronic, not at goal. - diffuse scaly erythematous macules and papules with underlying dyspigmentation - Discussed Prescription "Field Treatment" topical Chemotherapy for Severe, Chronic Confluent Actinic Changes with Pre-Cancerous Actinic Keratoses Field treatment involves treatment of an entire area of skin that  has confluent Actinic Changes (Sun/ Ultraviolet light damage) and PreCancerous Actinic Keratoses by method of PhotoDynamic Therapy (PDT) and/or prescription Topical Chemotherapy agents such as 5-fluorouracil, 5-fluorouracil/calcipotriene, and/or imiquimod.  The purpose is to decrease the number of clinically evident and subclinical PreCancerous lesions to  prevent progression to development of skin cancer by chemically destroying early precancer changes that may or may not be visible.  It has been shown to reduce the risk of developing skin cancer in the treated area. As a result of treatment, redness, scaling, crusting, and open sores may occur during treatment course. One or more than one of these methods may be used and may have to be used several times to control, suppress and eliminate the PreCancerous changes. Discussed treatment course, expected reaction, and possible side effects. - Recommend daily broad spectrum sunscreen SPF 30+ to sun-exposed areas, reapply every 2 hours as needed.  - Staying in the shade or wearing long sleeves, sun glasses (UVA+UVB protection) and wide brim hats (4-inch brim around the entire circumference of the hat) are also recommended. - Call for new or changing lesions.  - Recommend another Red Light PDT treatment with debridement to face in fall/winter   Return in about 6 months (around 11/15/2024) for TBSE, Hx of Melanoma IS, Hx of BCC, Hx of AKs.  I, Rollie Clipper, RMA, am acting as scribe for Artemio Larry, MD .   Documentation: I have reviewed the above documentation for accuracy and completeness, and I agree with the above.  Artemio Larry, MD

## 2024-05-23 ENCOUNTER — Ambulatory Visit: Payer: Self-pay | Admitting: Dermatology

## 2024-05-23 ENCOUNTER — Encounter: Payer: Self-pay | Admitting: Dermatology

## 2024-05-23 LAB — SURGICAL PATHOLOGY

## 2024-05-23 NOTE — Telephone Encounter (Signed)
 Dr. Annette Barters discussed bx results with patient.  Patient is scheduled for 06/08/24 for WLE./sh

## 2024-05-23 NOTE — Telephone Encounter (Signed)
-----   Message from Artemio Larry sent at 05/23/2024 12:37 PM EDT ----- 1. Skin, L post shoulder :       - MALIGNANT MELANOMA IN SITU       TYPE: LENTIGINOUS       CLARK LEVEL: I       MARGINS: PRESENT IN EDGES OF SPECIMEN       - BASAL CELL CARCINOMA, MORPHEAFORM, PRESENT IN BASE OF BIOPSY  2. Skin, L inf clavicle :       JUNCTIONAL LENTIGINOUS MELANOCYTIC NEVUS, PERIPHERAL MARGIN INVOLVED  3. Skin, L lat upper thigh :       MELANOCYTIC NEVUS, COMPOUND LENTIGINOUS TYPE, BASE INVOLVED   Melanoma IS with BCC skin cancer- discussed pathology result with patient. needs wide local excision- please call to schedule. Benign mole Benign mole  - please call patient ----- Message ----- From: Interface, Lab In Three Zero One Sent: 05/23/2024  11:42 AM EDT To: Artemio Larry, MD

## 2024-06-08 ENCOUNTER — Ambulatory Visit: Admitting: Dermatology

## 2024-06-08 DIAGNOSIS — D0362 Melanoma in situ of left upper limb, including shoulder: Secondary | ICD-10-CM

## 2024-06-08 DIAGNOSIS — C44619 Basal cell carcinoma of skin of left upper limb, including shoulder: Secondary | ICD-10-CM

## 2024-06-08 NOTE — Progress Notes (Addendum)
   Follow-Up Visit   Subjective  Edward Petersen is a 73 y.o. 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 Patient presents for WLE.   The following portions of the chart were reviewed this encounter and updated as appropriate: medications, allergies, medical history  Review of Systems:  No other skin or systemic complaints except as noted in HPI or Assessment and Plan.  Objective  Well appearing patient in no apparent distress; mood and affect are within normal limits.  A focused examination was performed of the following areas: Left post shoulder  Relevant physical exam findings are noted in the Assessment and Plan.  Left Posterior Shoulder 7.0 mm pink biopsy site.  Assessment & Plan   MELANOMA IN SITU OF LEFT UPPER EXTREMITY INCLUDING SHOULDER (HCC) Left Posterior Shoulder Skin excision  Lesion length (cm):  0.7 Lesion width (cm):  0.7 Margin per side (cm):  0.5 Total excision diameter (cm):  1.7 Informed consent: discussed and consent obtained   Timeout: patient name, date of birth, surgical site, and procedure verified   Procedure prep:  Patient was prepped and draped in usual sterile fashion Prep type:  Povidone-iodine Anesthesia: the lesion was anesthetized in a standard fashion   Anesthetic:  1% lidocaine  w/ epinephrine 1-100,000 buffered w/ 8.4% NaHCO3 (Total 18 cc lido w/epi) Instrument used: #15 blade   Hemostasis achieved with: pressure and electrodesiccation   Outcome: patient tolerated procedure well with no complications   Additional details:  Tag at medial 3 o'clock from posterior view   Skin repair Complexity:  Complex Final length (cm):  5.7 Informed consent: discussed and consent obtained   Timeout: patient name, date of birth, surgical site, and procedure verified   Reason for type of repair: reduce tension to allow closure, reduce the risk of dehiscence, infection, and  necrosis, reduce subcutaneous dead space and avoid a hematoma, allow closure of the large defect, preserve normal anatomical and functional relationships and enhance both functionality and cosmetic results   Undermining: area extensively undermined   Undermining comment:  1.7 cm Subcutaneous layers (deep stitches):  Suture type: Vicryl (polyglactin 910)   Subcutaneous suture technique: inverted dermal. Fine/surface layer approximation (top stitches):  Suture type: nylon   Stitches: simple interrupted   Suture removal (days):  7 Hemostasis achieved with: suture and pressure Outcome: patient tolerated procedure well with no complications   Post-procedure details: sterile dressing applied and wound care instructions given   Dressing type: pressure dressing (mupirocin )    Specimen 1 - Surgical pathology Differential Diagnosis: Malignant melanoma in situ with BCC Check Margins: Yes [id number] Tag at medial 3 o'clock from posterior view  With BCC at same site BASAL CELL CARCINOMA (BCC) OF SKIN OF LEFT UPPER EXTREMITY INCLUDING SHOULDER     Return in about 1 week (around 06/15/2024) for suture removal.  I, Andrea Kerns, CMA, am acting as scribe for Rexene Rattler, MD .   Documentation: I have reviewed the above documentation for accuracy and completeness, and I agree with the above.  Rexene Rattler, MD

## 2024-06-08 NOTE — Patient Instructions (Signed)

## 2024-06-09 ENCOUNTER — Telehealth: Payer: Self-pay

## 2024-06-09 LAB — SURGICAL PATHOLOGY

## 2024-06-09 NOTE — Telephone Encounter (Signed)
 Patient doing well after surgery yesterday.

## 2024-06-13 ENCOUNTER — Ambulatory Visit: Payer: Self-pay | Admitting: Dermatology

## 2024-06-15 ENCOUNTER — Encounter: Payer: Self-pay | Admitting: Dermatology

## 2024-06-15 ENCOUNTER — Ambulatory Visit: Admitting: Dermatology

## 2024-06-15 DIAGNOSIS — Z48817 Encounter for surgical aftercare following surgery on the skin and subcutaneous tissue: Secondary | ICD-10-CM

## 2024-06-15 DIAGNOSIS — Z85828 Personal history of other malignant neoplasm of skin: Secondary | ICD-10-CM

## 2024-06-15 DIAGNOSIS — D0362 Melanoma in situ of left upper limb, including shoulder: Secondary | ICD-10-CM

## 2024-06-15 DIAGNOSIS — C44619 Basal cell carcinoma of skin of left upper limb, including shoulder: Secondary | ICD-10-CM

## 2024-06-15 DIAGNOSIS — Z86006 Personal history of melanoma in-situ: Secondary | ICD-10-CM

## 2024-06-15 NOTE — Progress Notes (Signed)
   Follow-Up Visit   Subjective  Edward Petersen is a 73 y.o. male who presents for the following: Suture removal  Pathology showed left posterior shoulder :       RESIDUAL MELANOMA IN SITU, MARGINS FREE AND RESIDUAL BASAL CELL CARCINOMA, MARGINS FREE   The following portions of the chart were reviewed this encounter and updated as appropriate: medications, allergies, medical history  Review of Systems:  No other skin or systemic complaints except as noted in HPI or Assessment and Plan.  Objective  Well appearing patient in no apparent distress; mood and affect are within normal limits.  Areas Examined: shoulder Relevant physical exam findings are noted in the Assessment and Plan.    Assessment & Plan    Encounter for Removal of Sutures - Incision site is clean, dry and intact. - Wound cleansed, sutures removed, wound cleansed and steri strips applied.  - Discussed pathology results showing left posterior shoulder :       RESIDUAL MELANOMA IN SITU, MARGINS FREE AND RESIDUAL BASAL CELL CARCINOMA, MARGINS FREE  - Patient advised to keep steri-strips dry until they fall off. - Scars remodel for a full year. - Once steri-strips fall off, patient can apply over-the-counter silicone scar cream once to twice a day to help with scar remodeling if desired. - Patient advised to call with any concerns or if they notice any new or changing lesions.  Return for as scheduled.  Edward Petersen, RMA, am acting as scribe for Rexene Rattler, MD .   Documentation: I have reviewed the above documentation for accuracy and completeness, and I agree with the above.  Rexene Rattler, MD

## 2024-06-15 NOTE — Patient Instructions (Signed)

## 2024-11-22 ENCOUNTER — Ambulatory Visit: Admitting: Dermatology

## 2024-11-22 DIAGNOSIS — D225 Melanocytic nevi of trunk: Secondary | ICD-10-CM

## 2024-11-22 DIAGNOSIS — W908XXA Exposure to other nonionizing radiation, initial encounter: Secondary | ICD-10-CM

## 2024-11-22 DIAGNOSIS — L570 Actinic keratosis: Secondary | ICD-10-CM | POA: Diagnosis not present

## 2024-11-22 DIAGNOSIS — L219 Seborrheic dermatitis, unspecified: Secondary | ICD-10-CM | POA: Diagnosis not present

## 2024-11-22 DIAGNOSIS — D485 Neoplasm of uncertain behavior of skin: Secondary | ICD-10-CM | POA: Diagnosis not present

## 2024-11-22 DIAGNOSIS — D1801 Hemangioma of skin and subcutaneous tissue: Secondary | ICD-10-CM

## 2024-11-22 DIAGNOSIS — L578 Other skin changes due to chronic exposure to nonionizing radiation: Secondary | ICD-10-CM | POA: Diagnosis not present

## 2024-11-22 DIAGNOSIS — L814 Other melanin hyperpigmentation: Secondary | ICD-10-CM | POA: Diagnosis not present

## 2024-11-22 DIAGNOSIS — C44519 Basal cell carcinoma of skin of other part of trunk: Secondary | ICD-10-CM | POA: Diagnosis not present

## 2024-11-22 DIAGNOSIS — Z86006 Personal history of melanoma in-situ: Secondary | ICD-10-CM | POA: Diagnosis not present

## 2024-11-22 DIAGNOSIS — L821 Other seborrheic keratosis: Secondary | ICD-10-CM

## 2024-11-22 DIAGNOSIS — C44319 Basal cell carcinoma of skin of other parts of face: Secondary | ICD-10-CM

## 2024-11-22 DIAGNOSIS — Z1283 Encounter for screening for malignant neoplasm of skin: Secondary | ICD-10-CM

## 2024-11-22 DIAGNOSIS — Z85828 Personal history of other malignant neoplasm of skin: Secondary | ICD-10-CM

## 2024-11-22 DIAGNOSIS — D229 Melanocytic nevi, unspecified: Secondary | ICD-10-CM

## 2024-11-22 DIAGNOSIS — C4491 Basal cell carcinoma of skin, unspecified: Secondary | ICD-10-CM

## 2024-11-22 HISTORY — DX: Basal cell carcinoma of skin, unspecified: C44.91

## 2024-11-22 MED ORDER — FLUOCINOLONE ACETONIDE 0.01 % OT OIL: TOPICAL_OIL | OTIC | 5 refills | Status: AC

## 2024-11-22 NOTE — Patient Instructions (Addendum)

## 2024-11-22 NOTE — Progress Notes (Unsigned)
 Follow-Up Visit   Subjective  Edward Petersen is a 73 y.o. male who presents for the following: Skin Cancer Screening and Upper Body Skin Exam  The patient presents for Upper Body Skin Exam (UBSE) for skin cancer screening and mole check. The patient has spots, moles and lesions to be evaluated, some may be new or changing. He has a bump on his left nose that comes and goes. Also a spot on the left lateral cheek, irritated by shaving. History of melanoma in situ left posterior shoulder, WLE 06/08/2024 and L spinal mid upper back, WLE 12/26/23. History of BCCs.    The following portions of the chart were reviewed this encounter and updated as appropriate: medications, allergies, medical history  Review of Systems:  No other skin or systemic complaints except as noted in HPI or Assessment and Plan.  Objective  Well appearing patient in no apparent distress; mood and affect are within normal limits.  All skin waist up examined. Relevant physical exam findings are noted in the Assessment and Plan.  left lateral eye adjacent to scar Pink papule 3 x 2 mm   Right Lower Back adjacent to white scar 5 x 3 mm pink papule  adj to white scar  L cheek x 4, L lower ear  helix x 1 (5) Pink scaly macules.   Assessment & Plan   NEOPLASM OF UNCERTAIN BEHAVIOR OF SKIN (2) left lateral eye adjacent to scar - Epidermal / dermal shaving  Lesion diameter (cm):  0.3 Informed consent: discussed and consent obtained   Patient was prepped and draped in usual sterile fashion: Area prepped with alcohol. Anesthesia: the lesion was anesthetized in a standard fashion   Anesthetic:  1% lidocaine  w/ epinephrine 1-100,000 buffered w/ 8.4% NaHCO3 Instrument used: flexible razor blade   Hemostasis achieved with: pressure, aluminum chloride and electrodesiccation   Outcome: patient tolerated procedure well    - Destruction of lesion  Destruction method: electrodesiccation and curettage   Informed consent:  discussed and consent obtained   Timeout:  patient name, date of birth, surgical site, and procedure verified Anesthesia: the lesion was anesthetized in a standard fashion   Anesthetic:  1% lidocaine  w/ epinephrine 1-100,000 local infiltration Curettage performed in three different directions: Yes   Electrodesiccation performed over the curetted area: Yes   Final wound size (cm):  0.4 Hemostasis achieved with:  pressure, aluminum chloride and electrodesiccation Outcome: patient tolerated procedure well with no complications   Post-procedure details: wound care instructions given   Post-procedure details comment:  Ointment and bandage applied.  Specimen 1 - Surgical pathology Differential Diagnosis: Recurrent BCC vs other Check Margins: No EDC today Right Lower Back adjacent to white scar - Epidermal / dermal shaving  Lesion diameter (cm):  0.5 Informed consent: discussed and consent obtained   Patient was prepped and draped in usual sterile fashion: Area prepped with alcohol. Anesthesia: the lesion was anesthetized in a standard fashion   Anesthetic:  1% lidocaine  w/ epinephrine 1-100,000 buffered w/ 8.4% NaHCO3 Instrument used: flexible razor blade   Hemostasis achieved with: pressure, aluminum chloride and electrodesiccation   Outcome: patient tolerated procedure well    - Destruction of lesion  Destruction method: electrodesiccation and curettage   Informed consent: discussed and consent obtained   Timeout:  patient name, date of birth, surgical site, and procedure verified Anesthesia: the lesion was anesthetized in a standard fashion   Anesthetic:  1% lidocaine  w/ epinephrine 1-100,000 local infiltration Curettage performed in three different  directions: Yes   Electrodesiccation performed over the curetted area: Yes   Final wound size (cm):  1 Hemostasis achieved with:  pressure, aluminum chloride and electrodesiccation Outcome: patient tolerated procedure well with no  complications   Post-procedure details: wound care instructions given   Post-procedure details comment:  Ointment and bandage applied.  Specimen 2 - Surgical pathology Differential Diagnosis: r/o BCC Check Margins: No EDC today AK (ACTINIC KERATOSIS) (5) L cheek x 4, L lower ear  helix x 1 (5) Actinic keratoses are precancerous spots that appear secondary to cumulative UV radiation exposure/sun exposure over time. They are chronic with expected duration over 1 year. A portion of actinic keratoses will progress to squamous cell carcinoma of the skin. It is not possible to reliably predict which spots will progress to skin cancer and so treatment is recommended to prevent development of skin cancer.  Recommend daily broad spectrum sunscreen SPF 30+ to sun-exposed areas, reapply every 2 hours as needed.  Recommend staying in the shade or wearing long sleeves, sun glasses (UVA+UVB protection) and wide brim hats (4-inch brim around the entire circumference of the hat). Call for new or changing lesions. - Destruction of lesion - L cheek x 4, L lower ear  helix x 1 (5)  Destruction method: cryotherapy   Informed consent: discussed and consent obtained   Lesion destroyed using liquid nitrogen: Yes   Region frozen until ice ball extended beyond lesion: Yes   Outcome: patient tolerated procedure well with no complications   Post-procedure details: wound care instructions given   Additional details:  Prior to procedure, discussed risks of blister formation, small wound, skin dyspigmentation, or rare scar following cryotherapy. Recommend Vaseline ointment to treated areas while healing.   Skin cancer screening performed today.  Actinic Damage - Chronic condition, secondary to cumulative UV/sun exposure - diffuse scaly erythematous macules with underlying dyspigmentation - Recommend daily broad spectrum sunscreen SPF 30+ to sun-exposed areas, reapply every 2 hours as needed.  - Staying in the  shade or wearing long sleeves, sun glasses (UVA+UVB protection) and wide brim hats (4-inch brim around the entire circumference of the hat) are also recommended for sun protection.  - Call for new or changing lesions.  Lentigines, Seborrheic Keratoses, Hemangiomas - Benign normal skin lesions - Benign-appearing - Call for any changes  Melanocytic Nevi - Tan-brown and/or pink-flesh-colored symmetric macules and papules - R spinal lower back 3.72mm med dark brown macule with notch  - R upper back 4mm speckled tan macule with slight irreg border - Benign appearing on exam today - Observation - Call clinic for new or changing moles - Recommend daily use of broad spectrum spf 30+ sunscreen to sun-exposed areas.   HISTORY OF MELANOMA IN SITU - No evidence of recurrence today-L spinal mid upper back, WLE 12/23/2023 - Recommend regular full body skin exams - Recommend daily broad spectrum sunscreen SPF 30+ to sun-exposed areas, reapply every 2 hours as needed.  - Call if any new or changing lesions are noted between office visit  HISTORY OF BASAL CELL CARCINOMA OF THE SKIN - No evidence of recurrence today- L lat eye, L nasal alar crease - Recommend regular full body skin exams - Recommend daily broad spectrum sunscreen SPF 30+ to sun-exposed areas, reapply every 2 hours as needed.  - Call if any new or changing lesions are noted between office visits  SEBORRHEIC DERMATITIS Exam: Pink patches with greasy scale at ear canals, pt states bumps come and go- alar crease  clear today.   Chronic and persistent condition with duration or expected duration over one year. Condition is symptomatic/ bothersome to patient. Not currently at goal.  Seborrheic Dermatitis is a chronic persistent rash characterized by pinkness and scaling most commonly of the mid face but also can occur on the scalp (dandruff), ears; mid chest, mid back and groin.  It tends to be exacerbated by stress and cooler weather.   People who have neurologic disease may experience new onset or exacerbation of existing seborrheic dermatitis.  The condition is not curable but treatable and can be controlled.  Treatment Plan: Start fluocinolone  oil - 1-2 gtts to ear canals and rash around nose once to twice daily as needed for itch dsp 20mL 5Rf.    Return in about 6 months (around 05/23/2025) for UBSE, Hx melanoma IS, Hx BCC.  IAndrea Kerns, CMA, am acting as scribe for Rexene Rattler, MD .   Documentation: I have reviewed the above documentation for accuracy and completeness, and I agree with the above.  Rexene Rattler, MD

## 2024-11-23 LAB — SURGICAL PATHOLOGY

## 2024-11-29 ENCOUNTER — Ambulatory Visit: Payer: Self-pay | Admitting: Dermatology

## 2024-11-29 ENCOUNTER — Encounter: Payer: Self-pay | Admitting: Dermatology

## 2024-11-29 NOTE — Telephone Encounter (Signed)
 Patient advised of BX results. aw

## 2024-11-29 NOTE — Telephone Encounter (Signed)
-----   Message from Rexene Rattler, MD sent at 11/29/2024 11:14 AM EST ----- 1. Skin, left lateral eye adjacent to scar :       SUPERFICIAL BASAL CELL CARCINOMA  2. Skin, right lower back adjacent to Dennise Bamber scar :       SUPERFICIAL BASAL CELL CARCINOMA   1 and 2. BCC skin cancers- both already treated with EDC at time of biopsy   - please call patient

## 2025-06-05 ENCOUNTER — Ambulatory Visit: Admitting: Dermatology
